# Patient Record
Sex: Female | Born: 1975
Health system: Southern US, Community
[De-identification: ages and names within clinical notes are randomized; demographics above are authoritative.]

## PROBLEM LIST (undated history)

## (undated) DIAGNOSIS — J45909 Unspecified asthma, uncomplicated: Secondary | ICD-10-CM

## (undated) DIAGNOSIS — E079 Disorder of thyroid, unspecified: Secondary | ICD-10-CM

## (undated) DIAGNOSIS — I10 Essential (primary) hypertension: Secondary | ICD-10-CM

## (undated) DIAGNOSIS — E119 Type 2 diabetes mellitus without complications: Secondary | ICD-10-CM

## (undated) DIAGNOSIS — D649 Anemia, unspecified: Secondary | ICD-10-CM

## (undated) DIAGNOSIS — E0500 Thyrotoxicosis with diffuse goiter without thyrotoxic crisis or storm: Secondary | ICD-10-CM

## (undated) HISTORY — PX: THYROIDECTOMY: SHX17

---

## 2019-01-30 ENCOUNTER — Emergency Department (HOSPITAL_COMMUNITY)
Admission: EM | Admit: 2019-01-30 | Discharge: 2019-01-30 | Disposition: A | Discharge status: Home / Self Care | Payer: BLUE CROSS/BLUE SHIELD | Attending: Emergency Medicine | Admitting: Emergency Medicine

## 2019-01-30 ENCOUNTER — Encounter (HOSPITAL_COMMUNITY): Payer: Self-pay | Admitting: Emergency Medicine

## 2019-01-30 ENCOUNTER — Other Ambulatory Visit: Payer: Self-pay

## 2019-01-30 DIAGNOSIS — B349 Viral infection, unspecified: Secondary | ICD-10-CM | POA: Diagnosis not present

## 2019-01-30 DIAGNOSIS — M7918 Myalgia, other site: Secondary | ICD-10-CM | POA: Diagnosis present

## 2019-01-30 DIAGNOSIS — J45909 Unspecified asthma, uncomplicated: Secondary | ICD-10-CM | POA: Insufficient documentation

## 2019-01-30 DIAGNOSIS — E119 Type 2 diabetes mellitus without complications: Secondary | ICD-10-CM | POA: Insufficient documentation

## 2019-01-30 DIAGNOSIS — I10 Essential (primary) hypertension: Secondary | ICD-10-CM | POA: Insufficient documentation

## 2019-01-30 DIAGNOSIS — R509 Fever, unspecified: Secondary | ICD-10-CM | POA: Insufficient documentation

## 2019-01-30 DIAGNOSIS — Z20822 Contact with and (suspected) exposure to covid-19: Secondary | ICD-10-CM | POA: Insufficient documentation

## 2019-01-30 HISTORY — DX: Anemia, unspecified: D64.9

## 2019-01-30 HISTORY — DX: Essential (primary) hypertension: I10

## 2019-01-30 HISTORY — DX: Thyrotoxicosis with diffuse goiter without thyrotoxic crisis or storm: E05.00

## 2019-01-30 HISTORY — DX: Type 2 diabetes mellitus without complications: E11.9

## 2019-01-30 HISTORY — DX: Unspecified asthma, uncomplicated: J45.909

## 2019-01-30 HISTORY — DX: Disorder of thyroid, unspecified: E07.9

## 2019-01-30 LAB — SARS CORONAVIRUS 2 (TAT 6-24 HRS): SARS Coronavirus 2: NEGATIVE

## 2019-01-30 NOTE — ED Triage Notes (Signed)
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 Had a covid test couple days ago and was negative. Had Motrin 400mg  4 hours ago. alos been alternating tylenol with the motrin.

## 2019-01-30 NOTE — Discharge Instructions (Signed)
Person Under Monitoring Name: Kristin Hurst  Location: [address] [place] [address]   Infection Prevention Recommendations for Individuals Confirmed to have, or Being Evaluated for, 2019 Novel Coronavirus (COVID-19) Infection Who Receive Care at Home  Individuals who are confirmed to have, or are being evaluated for, COVID-19 should follow the prevention steps below until a healthcare provider or local or state health department says they can return to normal activities.  Stay home except to get medical care You should restrict activities outside your home, except for getting medical care. Do not go to work, school, or public areas, and do not use public transportation or taxis.  Call ahead before visiting your doctor Before your medical appointment, call the healthcare provider and tell them that you have, or are being evaluated for, COVID-19 infection. This will help the healthcare provider's office take steps to keep other people from getting infected. Ask your healthcare provider to call the local or state health department.  Monitor your symptoms Seek prompt medical attention if your illness is worsening (e.g., difficulty breathing). Before going to your medical appointment, call the healthcare provider and tell them that you have, or are being evaluated for, COVID-19 infection. Ask your healthcare provider to call the local or state health department.  Wear a facemask You should wear a facemask that covers your nose and mouth when you are in the same room with other people and when you visit a healthcare provider. People who live with or visit you should also wear a facemask while they are in the same room with you.  Separate yourself from other people in your home As much as possible, you should stay in a different room from other people in your home. Also, you should use a separate bathroom, if available.  Avoid sharing household items You should  not share dishes, drinking glasses, cups, eating utensils, towels, bedding, or other items with other people in your home. After using these items, you should wash them thoroughly with soap and water.  Cover your coughs and sneezes Cover your mouth and nose with a tissue when you cough or sneeze, or you can cough or sneeze into your sleeve. Throw used tissues in a lined trash can, and immediately wash your hands with soap and water for at least 20 seconds or use an alcohol-based hand rub.  Wash your [employer] your hands often and thoroughly with soap and water for at least 20 seconds. You can use an alcohol-based hand sanitizer if soap and water are not available and if your hands are not visibly dirty. Avoid touching your eyes, nose, and mouth with unwashed hands.   Prevention Steps for Caregivers and Household Members of Individuals Confirmed to have, or Being Evaluated for, COVID-19 Infection Being Cared for in the Home  If you live with, or provide care at home for, a person confirmed to have, or being evaluated for, COVID-19 infection please follow these guidelines to prevent infection:  Follow healthcare provider's instructions Make sure that you understand and can help the patient follow any healthcare provider instructions for all care.  Provide for the patient's basic needs You should help the patient with basic needs in the home and provide support for getting groceries, prescriptions, and other personal needs.  Monitor the patient's symptoms If they are getting sicker, call his or her medical provider and tell them that the patient has, or is being evaluated for, COVID-19 infection. This will help the healthcare provider's office  take steps to keep other people from getting infected. Ask the healthcare provider to call the local or state health department.  Limit the number of people who have contact with the patient If possible, have only one caregiver for the  patient. Other household members should stay in another home or place of residence. If this is not possible, they should stay in another room, or be separated from the patient as much as possible. Use a separate bathroom, if available. Restrict visitors who do not have an essential need to be in the home.  Keep older adults, very young children, and other sick people away from the patient Keep older adults, very young children, and those who have compromised immune systems or chronic health conditions away from the patient. This includes people with chronic heart, lung, or kidney conditions, diabetes, and cancer.  Ensure good ventilation Make sure that shared spaces in the home have good air flow, such as from an air conditioner or an opened window, weather permitting.  Wash your hands often Wash your hands often and thoroughly with soap and water for at least 20 seconds. You can use an alcohol based hand sanitizer if soap and water are not available and if your hands are not visibly dirty. Avoid touching your eyes, nose, and mouth with unwashed hands. Use disposable paper towels to dry your hands. If not available, use dedicated cloth towels and replace them when they become wet.  Wear a facemask and gloves Wear a disposable facemask at all times in the room and gloves when you touch or have contact with the patient's blood, body fluids, and/or secretions or excretions, such as sweat, saliva, sputum, nasal mucus, vomit, urine, or feces.  Ensure the mask fits over your nose and mouth tightly, and do not touch it during use. Throw out disposable facemasks and gloves after using them. Do not reuse. Wash your hands immediately after removing your facemask and gloves. If your personal clothing becomes contaminated, carefully remove clothing and launder. Wash your hands after handling contaminated clothing. Place all used disposable facemasks, gloves, and other waste in a lined container before  disposing them with other household waste. Remove gloves and wash your hands immediately after handling these items.  Do not share dishes, glasses, or other household items with the patient Avoid sharing household items. You should not share dishes, drinking glasses, cups, eating utensils, towels, bedding, or other items with a patient who is confirmed to have, or being evaluated for, COVID-19 infection. After the person uses these items, you should wash them thoroughly with soap and water.  Wash laundry thoroughly Immediately remove and wash clothes or bedding that have blood, body fluids, and/or secretions or excretions, such as sweat, saliva, sputum, nasal mucus, vomit, urine, or feces, on them. Wear gloves when handling laundry from the patient. Read and follow directions on labels of laundry or clothing items and detergent. In general, wash and dry with the warmest temperatures recommended on the label.  Clean all areas the individual has used often Clean all touchable surfaces, such as counters, tabletops, doorknobs, bathroom fixtures, toilets, phones, keyboards, tablets, and bedside tables, every day. Also, clean any surfaces that may have blood, body fluids, and/or secretions or excretions on them. Wear gloves when cleaning surfaces the patient has come in contact with. Use a diluted bleach solution (e.g., dilute bleach with 1 part bleach and 10 parts water) or a household disinfectant with a label that says EPA-registered for coronaviruses. To make a bleach  solution at home, add 1 tablespoon of bleach to 1 quart (4 cups) of water. For a larger supply, add  cup of bleach to 1 gallon (16 cups) of water. Read labels of cleaning products and follow recommendations provided on product labels. Labels contain instructions for safe and effective use of the cleaning product including precautions you should take when applying the product, such as wearing gloves or eye protection and making sure you  have good ventilation during use of the product. Remove gloves and wash hands immediately after cleaning.  Monitor yourself for signs and symptoms of illness Caregivers and household members are considered close contacts, should monitor their health, and will be asked to limit movement outside of the home to the extent possible. Follow the monitoring steps for close contacts listed on the symptom monitoring form.   ? If you have additional questions, contact your local health department or call the epidemiologist on call at [phone] (available 24/7). ? This guidance is subject to change. For the most up-to-date guidance from [facility], please refer to their website: [email or web address]

## 2019-01-30 NOTE — ED Provider Notes (Signed)
[facility]-EMERGENCY DEPT Provider Note   CSN: [id number] Arrival date & time: 01/30/19  8676     History Chief Complaint  Patient presents with  . Generalized Body Aches  . Fever  . Chills  . Cough    Kristin Hurst is a 44 y.o. female.  44 year old female with history of diabetes presents with 3 days of URI symptoms.  She has had cough and congestion.  No vomiting or diarrhea.  Has had fever which has been treated with Motrin as well as Tylenol.  Denies any dyspnea on exertion.  Some scratchy throat but no trouble swallowing.  Had a rapid Covid test done several days ago which was negative.  Does work in a nursing facility.        Past Medical History:  Diagnosis Date  . Anemia   . Asthma   . Diabetes mellitus without complication (HCC)   . Graves disease   . Hypertension   . Thyroid disease     There are no problems to display for this patient.   Past Surgical History:  Procedure Laterality Date  . THYROIDECTOMY       OB History   No obstetric history on file.     No family history on file.  Social History   Tobacco Use  . Smoking status: Not on file  Substance Use Topics  . Alcohol use: Not on file  . Drug use: Not on file    Home Medications Prior to Admission medications   Not on File    Allergies    Patient has no allergy information on record.  Review of Systems   Review of Systems  All other systems reviewed and are negative.   Physical Exam Updated Vital Signs BP 131/87 (BP Location: Right Arm)   Pulse 88   Temp 98.3 F (36.8 C) (Oral)   Resp 20   SpO2 100%   Physical Exam Vitals and nursing note reviewed.  Constitutional:      General: She is not in acute distress.    Appearance: Normal appearance. She is well-developed. She is not toxic-appearing.  HENT:     Head: Normocephalic and atraumatic.  Eyes:     General: Lids are normal.     Conjunctiva/sclera: Conjunctivae normal.     Pupils: Pupils  are equal, round, and reactive to light.  Neck:     Thyroid: No thyroid mass.     Trachea: No tracheal deviation.  Cardiovascular:     Rate and Rhythm: Normal rate and regular rhythm.     Heart sounds: Normal heart sounds. No murmur. No gallop.   Pulmonary:     Effort: Pulmonary effort is normal. No respiratory distress.     Breath sounds: Normal breath sounds. No stridor. No decreased breath sounds, wheezing, rhonchi or rales.  Abdominal:     General: Bowel sounds are normal. There is no distension.     Palpations: Abdomen is soft.     Tenderness: There is no abdominal tenderness. There is no rebound.  Musculoskeletal:        General: No tenderness. Normal range of motion.     Cervical back: Normal range of motion and neck supple.  Skin:    General: Skin is warm and dry.     Findings: No abrasion or rash.  Neurological:     Mental Status: She is alert and oriented to person, place, and time.     GCS: GCS eye subscore is 4. GCS verbal  subscore is 5. GCS motor subscore is 6.     Cranial Nerves: No cranial nerve deficit.     Sensory: No sensory deficit.  Psychiatric:        Speech: Speech normal.        Behavior: Behavior normal.     ED Results / Procedures / Treatments   Labs (all labs ordered are listed, but only abnormal results are displayed) Labs Reviewed  SARS CORONAVIRUS 2 (TAT 6-24 HRS)    EKG None  Radiology No results found.  Procedures Procedures (including critical care time)  Medications Ordered in ED Medications - No data to display  ED Course  I have reviewed the triage vital signs and the nursing notes.  Pertinent labs & imaging results that were available during my care of the patient were reviewed by me and considered in my medical decision making (see chart for details).    MDM Rules/Calculators/A&P                      Patient's vital signs are stable here.  Patient will have PCR Covid test sent.  Has been instructed to quarantine. Final  Clinical Impression(s) / ED Diagnoses Final diagnoses:  None    Rx / DC Orders ED Discharge Orders    None       Lacretia Leigh, MD 01/30/19 [phone]

## 2019-02-10 ENCOUNTER — Emergency Department (HOSPITAL_COMMUNITY): Payer: BLUE CROSS/BLUE SHIELD

## 2019-02-10 ENCOUNTER — Other Ambulatory Visit: Payer: Self-pay

## 2019-02-10 ENCOUNTER — Encounter (HOSPITAL_COMMUNITY): Payer: Self-pay | Admitting: Emergency Medicine

## 2019-02-10 ENCOUNTER — Emergency Department (HOSPITAL_COMMUNITY)
Admission: EM | Admit: 2019-02-10 | Discharge: 2019-02-10 | Disposition: A | Discharge status: Home / Self Care | Payer: BLUE CROSS/BLUE SHIELD | Attending: Emergency Medicine | Admitting: Emergency Medicine

## 2019-02-10 DIAGNOSIS — E119 Type 2 diabetes mellitus without complications: Secondary | ICD-10-CM | POA: Diagnosis not present

## 2019-02-10 DIAGNOSIS — Z20822 Contact with and (suspected) exposure to covid-19: Secondary | ICD-10-CM | POA: Diagnosis not present

## 2019-02-10 DIAGNOSIS — Z79899 Other long term (current) drug therapy: Secondary | ICD-10-CM | POA: Diagnosis not present

## 2019-02-10 DIAGNOSIS — R05 Cough: Secondary | ICD-10-CM | POA: Diagnosis present

## 2019-02-10 DIAGNOSIS — I10 Essential (primary) hypertension: Secondary | ICD-10-CM | POA: Diagnosis not present

## 2019-02-10 DIAGNOSIS — B349 Viral infection, unspecified: Secondary | ICD-10-CM | POA: Insufficient documentation

## 2019-02-10 DIAGNOSIS — E876 Hypokalemia: Secondary | ICD-10-CM | POA: Insufficient documentation

## 2019-02-10 DIAGNOSIS — J45909 Unspecified asthma, uncomplicated: Secondary | ICD-10-CM | POA: Insufficient documentation

## 2019-02-10 LAB — CBC WITH DIFFERENTIAL/PLATELET
Abs Immature Granulocytes: 0.04 10*3/uL (ref 0.00–0.07)
Basophils Absolute: 0.1 10*3/uL (ref 0.0–0.1)
Basophils Relative: 0 %
Eosinophils Absolute: 0.1 10*3/uL (ref 0.0–0.5)
Eosinophils Relative: 1 %
HCT: 36.8 % (ref 36.0–46.0)
Hemoglobin: 10.1 g/dL — ABNORMAL LOW (ref 12.0–15.0)
Immature Granulocytes: 0 %
Lymphocytes Relative: 28 %
Lymphs Abs: 3.5 10*3/uL (ref 0.7–4.0)
MCH: 18.8 pg — ABNORMAL LOW (ref 26.0–34.0)
MCHC: 27.4 g/dL — ABNORMAL LOW (ref 30.0–36.0)
MCV: 68.7 fL — ABNORMAL LOW (ref 80.0–100.0)
Monocytes Absolute: 0.6 10*3/uL (ref 0.1–1.0)
Monocytes Relative: 5 %
Neutro Abs: 8.1 10*3/uL — ABNORMAL HIGH (ref 1.7–7.7)
Neutrophils Relative %: 66 %
Platelets: 297 10*3/uL (ref 150–400)
RBC: 5.36 MIL/uL — ABNORMAL HIGH (ref 3.87–5.11)
RDW: 22.1 % — ABNORMAL HIGH (ref 11.5–15.5)
WBC: 12.3 10*3/uL — ABNORMAL HIGH (ref 4.0–10.5)
nRBC: 0 % (ref 0.0–0.2)

## 2019-02-10 LAB — BASIC METABOLIC PANEL
Anion gap: 13 (ref 5–15)
BUN: 9 mg/dL (ref 6–20)
CO2: 26 mmol/L (ref 22–32)
Calcium: 8.9 mg/dL (ref 8.9–10.3)
Chloride: 100 mmol/L (ref 98–111)
Creatinine, Ser: 0.65 mg/dL (ref 0.44–1.00)
GFR calc Af Amer: >60 mL/min (ref >60)
GFR calc non Af Amer: >60 mL/min (ref >60)
Glucose, Bld: 125 mg/dL — ABNORMAL HIGH (ref 70–99)
Potassium: 2.6 mmol/L — CL (ref 3.5–5.1)
Sodium: 139 mmol/L (ref 135–145)

## 2019-02-10 LAB — D-DIMER, QUANTITATIVE: D-Dimer, Quant: 1.63 ug/mL-FEU — ABNORMAL HIGH (ref 0.00–0.50)

## 2019-02-10 LAB — MAGNESIUM: Magnesium: 1.7 mg/dL (ref 1.7–2.4)

## 2019-02-10 MED ORDER — SODIUM CHLORIDE 0.9 % IV BOLUS
1000.0000 mL | Freq: Once | INTRAVENOUS | Status: AC
  Administered 2019-02-10: 1000 mL via INTRAVENOUS

## 2019-02-10 MED ORDER — OXYCODONE-ACETAMINOPHEN 5-325 MG PO TABS
1.0000 | ORAL_TABLET | Freq: Once | ORAL | Status: AC
  Administered 2019-02-10: 1 via ORAL
  Filled 2019-02-10: qty 1

## 2019-02-10 MED ORDER — POTASSIUM CHLORIDE CRYS ER 20 MEQ PO TBCR
40.0000 meq | EXTENDED_RELEASE_TABLET | Freq: Once | ORAL | Status: AC
  Administered 2019-02-10: 40 meq via ORAL
  Filled 2019-02-10: qty 2

## 2019-02-10 MED ORDER — POTASSIUM CHLORIDE 10 MEQ/100ML IV SOLN
10.0000 meq | INTRAVENOUS | Status: DC
  Administered 2019-02-10 (×2): 10 meq via INTRAVENOUS
  Filled 2019-02-10 (×2): qty 100

## 2019-02-10 MED ORDER — POTASSIUM CHLORIDE CRYS ER 20 MEQ PO TBCR: 20.0000 meq | EXTENDED_RELEASE_TABLET | Freq: Two times a day (BID) | ORAL | 0 refills | Status: DC

## 2019-02-10 NOTE — ED Triage Notes (Signed)
Pt reports that she was seen here 2 weeks ago and was Covid negative, told has the flu. Reports still having bad dry cough, body aches, and chills.

## 2019-02-10 NOTE — ED Provider Notes (Signed)
[facility] DEPT Provider Note   CSN: [id number] Arrival date & time: 02/10/19  1710     History Chief Complaint  Patient presents with   Cough   Generalized Body Aches   Chills    Kristin Hurst is a 44 y.o. female.  HPI  Patient is 44 year old female with a history of Graves' disease status post thyroidectomy now on levothyroxine history of GERD, hypertension on medication for this.  Patient presents today for now over 2 weeks of body aches, cough, congestion, hoarse voice. Patient was seen 2 weeks ago and diagnosed with URI at this emergency department after negative PCR Covid test.  She states that her symptoms have persisted and states that she is having some mild shortness of breath, fatigue, body aches intermittent swelling and tenderness and occasional cramping in both her legs that is currently not present.  She also states she has had a hoarse voice.  Does endorse diarrhea for the past week as well.   Patient denies any weight changes, hyper or hypothermia.  Heat or cold intolerance.  Denies sweatiness or dry skin.  Denies any abdominal pain.       Past Medical History:  Diagnosis Date   Anemia    Asthma    Diabetes mellitus without complication ([place])    Graves disease    Hypertension    Thyroid disease     There are no problems to display for this patient.   Past Surgical History:  Procedure Laterality Date   THYROIDECTOMY       OB History   No obstetric history on file.     No family history on file.  Social History   Tobacco Use   Smoking status: Not on file  Substance Use Topics   Alcohol use: Not on file   Drug use: Not on file    Home Medications Prior to Admission medications   Medication Sig Start Date End Date Taking? Authorizing Provider  albuterol (VENTOLIN HFA) 108 (90 Base) MCG/ACT inhaler Inhale 1-2 puffs into the lungs every 6 (six) hours as needed for wheezing or shortness of breath.    Yes [provider]  amLODipine (NORVASC) 10 MG tablet Take 10 mg by mouth daily.   Yes [provider]  azelastine (OPTIVAR) 0.05 % ophthalmic solution Place 1 drop into both eyes daily.   Yes [provider]  calcium carbonate (OS-CAL - DOSED IN MG OF ELEMENTAL CALCIUM) 1250 (500 Ca) MG tablet Take 1 tablet by mouth daily.   Yes [provider]  Dulaglutide (TRULICITY) 3.32 RJ/1.8AC SOPN Inject 0.75 mg into the skin once a week.   Yes [provider]  ferrous sulfate 325 (65 FE) MG tablet Take 325 mg by mouth daily with breakfast.   Yes [provider]  levothyroxine (SYNTHROID) 200 MCG tablet Take 200 mcg by mouth daily before breakfast.   Yes [provider]  omeprazole (PRILOSEC) 20 MG capsule Take 20 mg by mouth daily.   Yes [provider]  Potassium 99 MG TABS Take 1 tablet by mouth daily.   Yes [provider]  ramipril (ALTACE) 10 MG capsule Take 10 mg by mouth daily.   Yes [provider]  potassium chloride SA (KLOR-CON) 20 MEQ tablet Take 1 tablet (20 mEq total) by mouth 2 (two) times daily for 7 days. 02/10/19 02/17/19  Maudie Flakes, MD    Allergies    Shellfish allergy  Review of Systems   Review  of Systems  Constitutional: Positive for chills and fatigue. Negative for fever.  HENT: Positive for congestion.        Hoarse voice  Eyes: Negative for pain.  Respiratory: Positive for chest tightness and shortness of breath. Negative for cough.   Cardiovascular: Positive for leg swelling (intermittent). Negative for chest pain and palpitations.  Gastrointestinal: Negative for abdominal pain and vomiting.  Genitourinary: Negative for dysuria.  Musculoskeletal: Negative for myalgias.       BL leg cramps  Skin: Negative for rash.  Neurological: Negative for dizziness and headaches.    Physical Exam Updated Vital Signs BP 138/85 (BP Location: Right Arm)    Pulse 88    Temp 98.9 F (37.2  C) (Oral)    Resp 16    LMP 01/23/2019    SpO2 100%   Physical Exam Vitals and nursing note reviewed.  Constitutional:      General: She is not in acute distress.    Comments: Patient is pleasant 44 year old female appears stated age no acute distress  HENT:     Head: Normocephalic and atraumatic.     Nose: Nose normal.  Eyes:     General: No scleral icterus. Cardiovascular:     Rate and Rhythm: Regular rhythm. Tachycardia present.     Pulses: Normal pulses.     Heart sounds: Normal heart sounds.     Comments: HR 110 Pulmonary:     Effort: Pulmonary effort is normal. No respiratory distress.     Breath sounds: No wheezing.  Abdominal:     Palpations: Abdomen is soft.     Tenderness: There is no abdominal tenderness.  Musculoskeletal:     Cervical back: Normal range of motion.     Right lower leg: No edema.     Left lower leg: No edema.     Comments: No lower extremity edema, no leg swelling, negative Homans' sign  Skin:    General: Skin is warm and dry.     Capillary Refill: Capillary refill takes less than 2 seconds.  Neurological:     Mental Status: She is alert. Mental status is at baseline.     Comments: Patient moves all 4 extremities, normal gait, no sensation loss. CN intact.   Psychiatric:        Mood and Affect: Mood normal.        Behavior: Behavior normal.     ED Results / Procedures / Treatments   Labs (all labs ordered are listed, but only abnormal results are displayed) Labs Reviewed  BASIC METABOLIC PANEL - Abnormal; Notable for the following components:      Result Value   Potassium 2.6 (*)    Glucose, Bld 125 (*)    All other components within normal limits  CBC WITH DIFFERENTIAL/PLATELET - Abnormal; Notable for the following components:   WBC 12.3 (*)    RBC 5.36 (*)    Hemoglobin 10.1 (*)    MCV 68.7 (*)    MCH 18.8 (*)    MCHC 27.4 (*)    RDW 22.1 (*)    Neutro Abs 8.1 (*)    All other components within normal limits  D-DIMER,  QUANTITATIVE (NOT AT [facility]) - Abnormal; Notable for the following components:   D-Dimer, Quant 1.63 (*)    All other components within normal limits  SARS CORONAVIRUS 2 (TAT 6-24 HRS)  MAGNESIUM    EKG EKG Interpretation  Date/Time:  Wednesday February 10 2019 20:26:43 EST Ventricular Rate:  91 PR  Interval:    QRS Duration: 82 QT Interval:  391 QTC Calculation: 482 R Axis:   32 Text Interpretation: Sinus rhythm Low voltage, precordial leads Abnormal R-wave progression, early transition Confirmed by Kennis Carina [id number]) on 02/10/2019 8:32:47 PM   Radiology DG Chest Portable 1 View  Result Date: 02/10/2019 CLINICAL DATA:  Shortness of breath EXAM: PORTABLE CHEST 1 VIEW COMPARISON:  None. FINDINGS: The heart size and mediastinal contours are within normal limits. Both lungs are clear. The visualized skeletal structures are unremarkable. Postsurgical changes at the thoracic inlet. IMPRESSION: No active disease. Electronically Signed   By: Jasmine Pang M.D.   On: 02/10/2019 18:57    Procedures Procedures (including critical care time)  Medications Ordered in ED Medications  potassium chloride 10 mEq in 100 mL IVPB (0 mEq Intravenous Stopped 02/10/19 2220)  oxyCODONE-acetaminophen (PERCOCET/ROXICET) 5-325 MG per tablet 1 tablet (1 tablet Oral Given 02/10/19 1819)  sodium chloride 0.9 % bolus 1,000 mL (0 mLs Intravenous Stopped 02/10/19 1953)  potassium chloride SA (KLOR-CON) CR tablet 40 mEq (40 mEq Oral Given 02/10/19 2018)    ED Course  I have reviewed the triage vital signs and the nursing notes.  Pertinent labs & imaging results that were available during my care of the patient were reviewed by me and considered in my medical decision making (see chart for details).  Patient presents today for 2 weeks of cough, congestion, fatigue, body aches, leg cramps, hoarse voice.  She had negative Covid test beginning of the symptoms when seen in ED.  Her symptoms did not change she has no  new known exposures no new symptoms to indicate that she has Covid today.  Clinical Course as of Feb 11 24  Wed Feb 10, 2019  6256 Chest x-ray independently reviewed by myself.  No infiltrate, pulmonary edema or pleural effusion.  No broken bones.  DG Chest Portable 1 View [WF]  1924 Dimer elevated at 1.63 Dimer is elevated however patient is now not tachycardic after fluid administration.  She is found to be hypokalemic at 2.6.  Suspect this is the cause of her lower extremity pain.  Doubt DVT or PE.  Discussed case with Dr. Pilar Plate who discontinued CTPA study.   [WF]  2034 EKG is nonischemic.  Tachycardia has resolved with fluids.  No U waves present.  EKG 12-Lead [WF]  2134 Vitals WNLs with 100% sp02 no longer tachycardic. Afebrile and normal BP that normalized without intervention.     [WF]    Clinical Course User Index [WF] Gailen Shelter, PA   BMP notable for severe hypokalemia 2.6.  Will replete with p.o. 40 mEq and 3 IV doses of 10 mEq.  Magnesium is within normal limits.  Patient has mildly elevated WBC no baseline to compare to mild anemia 10.1.  Will discuss with patient there is no reference baseline labs to compare to.  Will see if patient can obtain reference lab work. Patient has baseline anemia of approximately 10. This is unchanged for her today.   Discussed results with patient she states she has had hypokalemia in the past but does not prescribed potassium supplementation she has been taking over-the-counter potassium occasionally is uncertain of the dose and states she takes it irregularly.  Does not take magnesium supplementation.  We will discharge patient with 40 equivalents potassium daily for the next 1 week as she will follow up with PCP within 1 month.  She states that she has insurance starting February 1.  On reassessment  of patient she is well appearing. Denies any SOB, CP and her SpO2 is 100% on room air with no tachycardia. Given she is PERC negative after K+  repletion and fluids I suspect her symptoms are due more to her hypokalemia than any VTE. Doubt PE or DVT. Discussed my reasoning with the patient who is understanding of plan and agreeable to treatment plan. She is given return precautions and will return to ED if she experieences any new or concerning symptoms. She was thoroughly educated on symptoms of PE/DVT.   Kristin Hurst was evaluated in Emergency Department on 02/11/2019 for the symptoms described in the history of present illness. She was evaluated in the context of the global COVID-19 pandemic, which necessitated consideration that the patient might be at risk for infection with the SARS-CoV-2 virus that causes COVID-19. Institutional protocols and algorithms that pertain to the evaluation of patients at risk for COVID-19 are in a state of rapid change based on information released by regulatory bodies including the CDC and federal and state organizations. These policies and algorithms were followed during the patient's care in the ED. I discussed this case with my attending physician who cosigned this note including patient's presenting symptoms, physical exam, and planned diagnostics and interventions. Attending physician stated agreement with plan or made changes to plan which were implemented.  Attending physician assessed patient at bedside.  MDM Rules/Calculators/A&P                       Final Clinical Impression(s) / ED Diagnoses Final diagnoses:  Hypokalemia  Viral illness    Rx / DC Orders ED Discharge Orders         Ordered    potassium chloride SA (KLOR-CON) 20 MEQ tablet  2 times daily     02/10/19 2138           Solon Augusta Arnett, [place] 02/11/19 0026    Sabas Sous, MD 02/12/19 2308

## 2019-02-10 NOTE — Discharge Instructions (Signed)
Please use Tylenol or ibuprofen for pain.  You may use 600 mg ibuprofen every 6 hours or 1000 mg of Tylenol every 6 hours.  You may choose to alternate between the 2.  This would be most effective.  Not to exceed 4 g of Tylenol within 24 hours.  Not to exceed 3200 mg ibuprofen 24 hours.  Take K+ as prescribed.   Eat K+ rich foods

## 2019-02-10 NOTE — ED Provider Notes (Signed)
  Provider Note MRN:  [id number]  Arrival date & time: 02/10/19    ED Course and Medical Decision Making  Assumed care from [facility] at shift change.  Patient has completed her runs of potassium and she is feeling better and requesting discharge.  We will continue repletion of potassium via diet and supplements for the next week and will follow up with PCP.  Strict return precautions.  .Critical Care Performed by: Sabas Sous, MD Authorized by: Sabas Sous, MD   Critical care provider statement:    Critical care time (minutes):  31   Critical care was necessary to treat or prevent imminent or life-threatening deterioration of the following conditions:  Metabolic crisis (Hypokalemia)   Critical care was time spent personally by me on the following activities:  Discussions with consultants, evaluation of patient's response to treatment, examination of patient, ordering and performing treatments and interventions, ordering and review of laboratory studies, ordering and review of radiographic studies, pulse oximetry, re-evaluation of patient's condition, obtaining history from patient or surrogate and review of old charts    Final Clinical Impressions(s) / ED Diagnoses     ICD-10-CM   1. Hypokalemia  E87.6   2. Viral illness  B34.9     ED Discharge Orders         Ordered    potassium chloride SA (KLOR-CON) 20 MEQ tablet  2 times daily     02/10/19 2138            Discharge Instructions     Please use Tylenol or ibuprofen for pain.  You may use 600 mg ibuprofen every 6 hours or 1000 mg of Tylenol every 6 hours.  You may choose to alternate between the 2.  This would be most effective.  Not to exceed 4 g of Tylenol within 24 hours.  Not to exceed 3200 mg ibuprofen 24 hours.  Take K+ as prescribed.   Eat K+ rich foods    Elmer Sow. Pilar Plate, MD [facility] [email or web address]    Sabas Sous, MD 02/10/19 2221

## 2019-02-11 LAB — SARS CORONAVIRUS 2 (TAT 6-24 HRS): SARS Coronavirus 2: NEGATIVE

## 2019-02-17 ENCOUNTER — Emergency Department (HOSPITAL_COMMUNITY): Payer: BLUE CROSS/BLUE SHIELD

## 2019-02-17 ENCOUNTER — Encounter (HOSPITAL_COMMUNITY): Payer: Self-pay

## 2019-02-17 ENCOUNTER — Other Ambulatory Visit: Payer: Self-pay

## 2019-02-17 ENCOUNTER — Emergency Department (HOSPITAL_COMMUNITY)
Admission: EM | Admit: 2019-02-17 | Discharge: 2019-02-17 | Disposition: A | Discharge status: Home / Self Care | Payer: BLUE CROSS/BLUE SHIELD | Attending: Emergency Medicine | Admitting: Emergency Medicine

## 2019-02-17 DIAGNOSIS — M79605 Pain in left leg: Secondary | ICD-10-CM | POA: Insufficient documentation

## 2019-02-17 DIAGNOSIS — R42 Dizziness and giddiness: Secondary | ICD-10-CM | POA: Insufficient documentation

## 2019-02-17 DIAGNOSIS — J45909 Unspecified asthma, uncomplicated: Secondary | ICD-10-CM | POA: Insufficient documentation

## 2019-02-17 DIAGNOSIS — Z79899 Other long term (current) drug therapy: Secondary | ICD-10-CM | POA: Diagnosis not present

## 2019-02-17 DIAGNOSIS — F17210 Nicotine dependence, cigarettes, uncomplicated: Secondary | ICD-10-CM | POA: Diagnosis not present

## 2019-02-17 DIAGNOSIS — M79604 Pain in right leg: Secondary | ICD-10-CM | POA: Diagnosis present

## 2019-02-17 DIAGNOSIS — E119 Type 2 diabetes mellitus without complications: Secondary | ICD-10-CM | POA: Diagnosis not present

## 2019-02-17 DIAGNOSIS — I10 Essential (primary) hypertension: Secondary | ICD-10-CM | POA: Insufficient documentation

## 2019-02-17 LAB — COMPREHENSIVE METABOLIC PANEL
ALT: 13 U/L (ref 0–44)
AST: 16 U/L (ref 15–41)
Albumin: 3.5 g/dL (ref 3.5–5.0)
Alkaline Phosphatase: 80 U/L (ref 38–126)
Anion gap: 12 (ref 5–15)
BUN: 10 mg/dL (ref 6–20)
CO2: 24 mmol/L (ref 22–32)
Calcium: 8.8 mg/dL — ABNORMAL LOW (ref 8.9–10.3)
Chloride: 104 mmol/L (ref 98–111)
Creatinine, Ser: 0.62 mg/dL (ref 0.44–1.00)
GFR calc Af Amer: >60 mL/min (ref >60)
GFR calc non Af Amer: >60 mL/min (ref >60)
Glucose, Bld: 162 mg/dL — ABNORMAL HIGH (ref 70–99)
Potassium: 3 mmol/L — ABNORMAL LOW (ref 3.5–5.1)
Sodium: 140 mmol/L (ref 135–145)
Total Bilirubin: 0.4 mg/dL (ref 0.3–1.2)
Total Protein: 7.2 g/dL (ref 6.5–8.1)

## 2019-02-17 LAB — URINALYSIS, ROUTINE W REFLEX MICROSCOPIC
Bacteria, UA: NONE SEEN
Bilirubin Urine: NEGATIVE
Glucose, UA: NEGATIVE mg/dL
Ketones, ur: NEGATIVE mg/dL
Leukocytes,Ua: NEGATIVE
Nitrite: NEGATIVE
Protein, ur: 30 mg/dL — AB
Specific Gravity, Urine: 1.024 (ref 1.005–1.030)
pH: 6 (ref 5.0–8.0)

## 2019-02-17 LAB — CBC WITH DIFFERENTIAL/PLATELET
Abs Immature Granulocytes: 0.05 10*3/uL (ref 0.00–0.07)
Basophils Absolute: 0 10*3/uL (ref 0.0–0.1)
Basophils Relative: 0 %
Eosinophils Absolute: 0.2 10*3/uL (ref 0.0–0.5)
Eosinophils Relative: 2 %
HCT: 34.5 % — ABNORMAL LOW (ref 36.0–46.0)
Hemoglobin: 9.6 g/dL — ABNORMAL LOW (ref 12.0–15.0)
Immature Granulocytes: 1 %
Lymphocytes Relative: 29 %
Lymphs Abs: 3.2 10*3/uL (ref 0.7–4.0)
MCH: 19.3 pg — ABNORMAL LOW (ref 26.0–34.0)
MCHC: 27.8 g/dL — ABNORMAL LOW (ref 30.0–36.0)
MCV: 69.3 fL — ABNORMAL LOW (ref 80.0–100.0)
Monocytes Absolute: 0.5 10*3/uL (ref 0.1–1.0)
Monocytes Relative: 4 %
Neutro Abs: 7.1 10*3/uL (ref 1.7–7.7)
Neutrophils Relative %: 64 %
Platelets: 308 10*3/uL (ref 150–400)
RBC: 4.98 MIL/uL (ref 3.87–5.11)
RDW: 22.7 % — ABNORMAL HIGH (ref 11.5–15.5)
WBC: 11.1 10*3/uL — ABNORMAL HIGH (ref 4.0–10.5)
nRBC: 0 % (ref 0.0–0.2)

## 2019-02-17 LAB — I-STAT BETA HCG BLOOD, ED (MC, WL, AP ONLY): I-stat hCG, quantitative: <5 m[IU]/mL (ref <5)

## 2019-02-17 LAB — TSH: TSH: 0.381 u[IU]/mL (ref 0.350–4.500)

## 2019-02-17 LAB — MAGNESIUM: Magnesium: 1.7 mg/dL (ref 1.7–2.4)

## 2019-02-17 MED ORDER — POTASSIUM CHLORIDE CRYS ER 20 MEQ PO TBCR
40.0000 meq | EXTENDED_RELEASE_TABLET | Freq: Once | ORAL | Status: AC
  Administered 2019-02-17: 40 meq via ORAL
  Filled 2019-02-17: qty 2

## 2019-02-17 MED ORDER — SODIUM CHLORIDE 0.9 % IV BOLUS
1000.0000 mL | Freq: Once | INTRAVENOUS | Status: AC
  Administered 2019-02-17: 1000 mL via INTRAVENOUS

## 2019-02-17 MED ORDER — NAPROXEN 500 MG PO TABS: 500.0000 mg | ORAL_TABLET | Freq: Two times a day (BID) | ORAL | 0 refills | Status: DC

## 2019-02-17 MED ORDER — OXYCODONE-ACETAMINOPHEN 5-325 MG PO TABS
2.0000 | ORAL_TABLET | Freq: Once | ORAL | Status: AC
  Administered 2019-02-17: 2 via ORAL
  Filled 2019-02-17: qty 2

## 2019-02-17 MED ORDER — METHOCARBAMOL 500 MG PO TABS
500.0000 mg | ORAL_TABLET | Freq: Once | ORAL | Status: AC
  Administered 2019-02-17: 500 mg via ORAL
  Filled 2019-02-17: qty 1

## 2019-02-17 MED ORDER — POTASSIUM CHLORIDE CRYS ER 20 MEQ PO TBCR: 20.0000 meq | EXTENDED_RELEASE_TABLET | Freq: Every day | ORAL | 0 refills | Status: DC

## 2019-02-17 MED ORDER — KETOROLAC TROMETHAMINE 15 MG/ML IJ SOLN
15.0000 mg | Freq: Once | INTRAMUSCULAR | Status: AC
  Administered 2019-02-17: 15 mg via INTRAVENOUS
  Filled 2019-02-17: qty 1

## 2019-02-17 MED ORDER — METHOCARBAMOL 500 MG PO TABS: 500.0000 mg | ORAL_TABLET | Freq: Three times a day (TID) | ORAL | 0 refills | Status: DC | PRN

## 2019-02-17 NOTE — Discharge Instructions (Addendum)
You were seen in the emergency department today for lightheadedness and leg pain.  Your work-up was overall reassuring.  Your labs look similar to prior labs, your potassium was somewhat low, we are sending home with a supplement and providing diet information.  Your calcium was also a bit low please see attached diet guidelines.  Your hemoglobin was mildly low with fairly similar to previous consistent with your anemia.  Your chest x-ray was normal.  We are sending you home with the following medicines:  - Naproxen is a nonsteroidal anti-inflammatory medication that will help with pain and swelling. Be sure to take this medication as prescribed with food, 1 pill every 12 hours,  It should be taken with food, as it can cause stomach upset, and more seriously, stomach bleeding. Do not take other nonsteroidal anti-inflammatory medications with this such as Advil, Motrin, Aleve, Mobic, Goodie Powder, or Motrin.    - Robaxin is the muscle relaxer I have prescribed, this is meant to help with muscle tightness. Be aware that this medication may make you drowsy therefore the first time you take this it should be at a time you are in an environment where you can rest. Do not drive or operate heavy machinery when taking this medication. Do not drink alcohol or take other sedating medications with this medicine such as narcotics or benzodiazepines.   You make take Tylenol per over the counter dosing with these medications.   We have prescribed you new medication(s) today. Discuss the medications prescribed today with your pharmacist as they can have adverse effects and interactions with your other medicines including over the counter and prescribed medications. Seek medical evaluation if you start to experience new or abnormal symptoms after taking one of these medicines, seek care immediately if you start to experience difficulty breathing, feeling of your throat closing, facial swelling, or rash as these could be  indications of a more serious allergic reaction   Please also take the potassium tablet.  Please follow-up with other pain management, primary care, rheumatology locally within the next 48 hours if possible.  Return to the emergency department for new or worsening symptoms including but not limited to increased pain, passing out, chest pain, or any other concerns.

## 2019-02-17 NOTE — ED Triage Notes (Signed)
Patient states she was seen 3 weeks ago and was told she had the flu. Patient was Covid negative at that time. Patient c/o extremity pain, cough, and SOB.

## 2019-02-17 NOTE — ED Notes (Signed)
Pt requesting additional pain medication. PA aware.  

## 2019-02-17 NOTE — ED Notes (Signed)
Pt transported to Xray at this time.

## 2019-02-17 NOTE — ED Provider Notes (Signed)
[facility] DEPT Provider Note   CSN: [id number] Arrival date & time: 02/17/19  1530     History Chief Complaint  Patient presents with  . Dizziness  . Extremity Pain    Kristin Hurst is a 44 y.o. female with a history of anemia, asthma, diabetes mellitus, hypertension, and Graves' disease status post thyroidectomy on Synthroid who presents to the emergency department with complaints of intermittent lightheadedness today as well as acute on chronic lower extremity pain.  Patient states that when she had transition from sitting to standing she has felt lightheaded if she might pass out 2 separate times today.  This lightheadedness improved when returning to the seated position.  No other alleviating or aggravating factors.  No true syncope occurred.  She also notes that she is having pain to her bilateral lower extremities which is severe, constant, worse with movement, no alleviating factors.  She states this pain is consistent with her chronic pain related to her rheumatoid arthritis in terms of quality but it is worse in severity.  She states that she takes 10 mg of immediate release oxycodone 3 times daily as well as 10 mg of extended release oxycodone twice daily.  She ran out of her pain medication about a week ago.  Her pain management doctor is in [place].  She does not get new medicines until the beginning of February.  She is new to the area and has not established care with a pain management doctor here yet.  She also mentions that she feels a bit nauseated, has had nasal congestion, and feels short of breath at times but states she is unsure if the shortness of breath is related to her smoking.  Denies fever, chills, emesis, chest pain, syncope, abdominal pain, melena, hematochezia, leg swelling, hemoptysis, recent surgery/trauma, recent long travel, hormone use, personal hx of cancer, or hx of DVT/PE.   HPI     Past Medical History:  Diagnosis Date    . Anemia   . Asthma   . Diabetes mellitus without complication ([place])   . Graves disease   . Hypertension   . Thyroid disease     There are no problems to display for this patient.   Past Surgical History:  Procedure Laterality Date  . THYROIDECTOMY       OB History   No obstetric history on file.     Family History  Problem Relation Age of Onset  . Heart failure Mother   . Diabetes Mother     Social History   Tobacco Use  . Smoking status: Current Every Day Smoker    Packs/day: 0.50    Types: Cigarettes  . Smokeless tobacco: Never Used  Substance Use Topics  . Alcohol use: Yes  . Drug use: Never    Home Medications Prior to Admission medications   Medication Sig Start Date End Date Taking? Authorizing Provider  albuterol (VENTOLIN HFA) 108 (90 Base) MCG/ACT inhaler Inhale 1-2 puffs into the lungs every 6 (six) hours as needed for wheezing or shortness of breath.    [provider]  amLODipine (NORVASC) 10 MG tablet Take 10 mg by mouth daily.    [provider]  azelastine (OPTIVAR) 0.05 % ophthalmic solution Place 1 drop into both eyes daily.    [provider]  calcium carbonate (OS-CAL - DOSED IN MG OF ELEMENTAL CALCIUM) 1250 (500 Ca) MG tablet Take 1 tablet by mouth daily.    [provider]  Dulaglutide (  TRULICITY) 0.75 MG/0.5ML SOPN Inject 0.75 mg into the skin once a week.    [provider]  ferrous sulfate 325 (65 FE) MG tablet Take 325 mg by mouth daily with breakfast.    [provider]  levothyroxine (SYNTHROID) 200 MCG tablet Take 200 mcg by mouth daily before breakfast.    [provider]  omeprazole (PRILOSEC) 20 MG capsule Take 20 mg by mouth daily.    [provider]  Potassium 99 MG TABS Take 1 tablet by mouth daily.    [provider]  potassium chloride SA (KLOR-CON) 20 MEQ tablet Take 1 tablet (20 mEq total) by mouth 2 (two) times daily for 7 days. 02/10/19 02/17/19   Sabas Sous, MD  ramipril (ALTACE) 10 MG capsule Take 10 mg by mouth daily.    [provider]    Allergies    Shellfish allergy  Review of Systems   Review of Systems  Constitutional: Negative for chills and fever.  HENT: Positive for congestion. Negative for ear pain and sore throat.   Respiratory: Positive for shortness of breath.   Cardiovascular: Negative for chest pain and leg swelling.  Gastrointestinal: Positive for nausea. Negative for abdominal pain, blood in stool, constipation, diarrhea and vomiting.  Genitourinary: Negative for dysuria.  Musculoskeletal: Positive for arthralgias and myalgias.  Neurological: Positive for dizziness and light-headedness. Negative for syncope, speech difficulty, weakness and numbness.  All other systems reviewed and are negative.   Physical Exam Updated Vital Signs BP (!) 137/93 (BP Location: Left Arm)   Pulse 100   Temp 99.8 F (37.7 C) (Oral)   Resp 17   Ht 5\' 3"  (1.6 m)   Wt 106.1 kg   LMP 01/23/2019   SpO2 99%   BMI 41.45 kg/m   Physical Exam Vitals and nursing note reviewed.  Constitutional:      General: She is not in acute distress.    Appearance: She is well-developed. She is not ill-appearing or toxic-appearing.  HENT:     Head: Normocephalic and atraumatic.     Right Ear: Ear canal normal. Tympanic membrane is not perforated, erythematous, retracted or bulging.     Left Ear: Ear canal normal. Tympanic membrane is not perforated, erythematous, retracted or bulging.     Ears:     Comments: No mastoid erythema/swelling/tenderness.     Nose:     Right Sinus: No maxillary sinus tenderness or frontal sinus tenderness.     Left Sinus: No maxillary sinus tenderness or frontal sinus tenderness.     Mouth/Throat:     Pharynx: Uvula midline. No oropharyngeal exudate or posterior oropharyngeal erythema.     Comments: Posterior oropharynx is symmetric appearing. Patient tolerating own secretions without difficulty.  No trismus. No drooling. No hot potato voice. No swelling beneath the tongue, submandibular compartment is soft.  Eyes:     General:        Right eye: No discharge.        Left eye: No discharge.     Conjunctiva/sclera: Conjunctivae normal.     Pupils: Pupils are equal, round, and reactive to light.  Cardiovascular:     Rate and Rhythm: Normal rate and regular rhythm.     Pulses:          Dorsalis pedis pulses are 2+ on the right side and 2+ on the left side.       Posterior tibial pulses are 2+ on the right side and 2+ on the left side.  Heart sounds: No murmur.  Pulmonary:     Effort: Pulmonary effort is normal. No respiratory distress.     Breath sounds: Normal breath sounds. No wheezing, rhonchi or rales.  Abdominal:     General: There is no distension.     Palpations: Abdomen is soft.     Tenderness: There is no abdominal tenderness. There is no guarding or rebound.  Musculoskeletal:     Cervical back: Normal range of motion and neck supple. No edema or rigidity.     Comments: Lower extremities: No obvious deformity, appreciable swelling, edema, erythema, ecchymosis, warmth, or open wounds. Patient has intact AROM to bilateral hips, knees, ankles, and all digits.  Diffusely tender to palpation throughout bilateral lower extremities including the hips, thighs, knees, calves, and ankles.  No point/focal bony tenderness.  Compartments are soft.  Lymphadenopathy:     Cervical: No cervical adenopathy.  Skin:    General: Skin is warm and dry.     Capillary Refill: Capillary refill takes less than 2 seconds.     Findings: No rash.  Neurological:     Mental Status: She is alert.     Comments: Alert. Clear speech. Sensation grossly intact to bilateral lower extremities. 5/5 strength with plantar/dorsiflexion bilaterally. Patient ambulatory.   Psychiatric:        Mood and Affect: Mood normal.        Behavior: Behavior normal.     ED Results / Procedures / Treatments   Labs (all  labs ordered are listed, but only abnormal results are displayed) Labs Reviewed  COMPREHENSIVE METABOLIC PANEL - Abnormal; Notable for the following components:      Result Value   Potassium 3.0 (*)    Glucose, Bld 162 (*)    Calcium 8.8 (*)    All other components within normal limits  CBC WITH DIFFERENTIAL/PLATELET - Abnormal; Notable for the following components:   WBC 11.1 (*)    Hemoglobin 9.6 (*)    HCT 34.5 (*)    MCV 69.3 (*)    MCH 19.3 (*)    MCHC 27.8 (*)    RDW 22.7 (*)    All other components within normal limits  URINALYSIS, ROUTINE W REFLEX MICROSCOPIC - Abnormal; Notable for the following components:   Hgb urine dipstick MODERATE (*)    Protein, ur 30 (*)    All other components within normal limits  MAGNESIUM  TSH  I-STAT BETA HCG BLOOD, ED ([facility], [facility], [facility] ONLY)    EKG EKG Interpretation  Date/Time:  Wednesday February 17 2019 15:41:31 EST Ventricular Rate:  97 PR Interval:  146 QRS Duration: 78 QT Interval:  386 QTC Calculation: 490 R Axis:   17 Text Interpretation: Normal sinus rhythm Septal infarct , age undetermined Abnormal ECG Confirmed by Marianna Fuss ([id number]) on 02/17/2019 8:59:58 PM   Radiology DG Chest 2 View  Result Date: 02/17/2019 CLINICAL DATA:  Cough, shortness of breath and extremity pain. EXAM: CHEST - 2 VIEW COMPARISON:  February 10, 2019 FINDINGS: There is no evidence of acute infiltrate, pleural effusion or pneumothorax. The heart size and mediastinal contours are within normal limits. Multiple radiopaque surgical clips are seen along the midline of the thoracic inlet. The visualized skeletal structures are unremarkable. IMPRESSION: No active cardiopulmonary disease. Electronically Signed   By: Aram Candela M.D.   On: 02/17/2019 16:23    Procedures Procedures (including critical care time)  Medications Ordered in ED Medications - No data to display  ED Course  I  have reviewed the triage vital signs and the nursing  notes.  Pertinent labs & imaging results that were available during my care of the patient were reviewed by me and considered in my medical decision making (see chart for details).    MDM Rules/Calculators/A&P                      Patient presents to the emergency department with complaints of intermittent lightheadedness today as well as acute on chronic bilateral lower extremity pain.  Patient is nontoxic-appearing, resting comfortably, vitals without significant abnormality, BP somewhat elevated, doubt HTN emergency.  Pregnancy test: Negative CBC: Mild leukocytosis felt to be nonspecific.  Hemoglobin/hematocrit fairly similar to prior. CMP: Mild hypokalemia, improved from prior, oral replacement.  Mild hypocalcemia.  Hyperglycemia without acidosis or anion gap elevation. Magnesium: WNL TSH: WNL Urinalysis: Hematuria, PCP follow-up. EKG: Sinus rhythm, no STEMI, no chest pain CXR: No active cardiopulmonary disease. Orthostatics without concerning changes.  Overall reassuring work-up in the emergency department. Lower extremities are without erythema, warmth, or open wounds, findings are not consistent with infectious process such as septic joint, cellulitis, or osteomyelitis.  Given symptoms are bilateral, no edema, feel that DVT is unlikely.  She is neurovascularly intact distally.  She states this pain is acute on chronic pain related to her rheumatoid arthritis.  Joints are overall well-appearing with good range of motion.  Regard to her lightheadedness, cardiac monitor reassuring, orthostatics reassuring, labs without significant abnormalities when compared to prior.  She has had some mild pain relief in the emergency department.  She will need to follow-up with her pain management doctor.  Will provide information for local pain management, rheumatology, & primary care.  We will try nonnarcotic medications as alternatives to go home with.I discussed results, treatment plan, need for  follow-up, and return precautions with the patient. Provided opportunity for questions, patient confirmed understanding and is in agreement with plan.   Findings and plan of care discussed with supervising physician Dr. Stevie Kern who is in agreement.   Final Clinical Impression(s) / ED Diagnoses Final diagnoses:  Pain in both lower extremities  Lightheadedness    Rx / DC Orders ED Discharge Orders         Ordered    naproxen (NAPROSYN) 500 MG tablet  2 times daily     02/17/19 2107    methocarbamol (ROBAXIN) 500 MG tablet  Every 8 hours PRN     02/17/19 2107    potassium chloride SA (KLOR-CON) 20 MEQ tablet  Daily     02/17/19 2107           Desmond Lope 02/17/19 2108    Milagros Loll, MD 02/18/19 1253

## 2019-03-07 ENCOUNTER — Other Ambulatory Visit: Payer: Self-pay

## 2019-03-07 ENCOUNTER — Encounter (HOSPITAL_COMMUNITY): Payer: Self-pay

## 2019-03-07 ENCOUNTER — Emergency Department (HOSPITAL_COMMUNITY)
Admission: EM | Admit: 2019-03-07 | Discharge: 2019-03-07 | Disposition: A | Discharge status: Home / Self Care | Payer: Medicaid Other | Attending: Emergency Medicine | Admitting: Emergency Medicine

## 2019-03-07 ENCOUNTER — Emergency Department (HOSPITAL_COMMUNITY): Payer: Medicaid Other

## 2019-03-07 DIAGNOSIS — E876 Hypokalemia: Secondary | ICD-10-CM | POA: Insufficient documentation

## 2019-03-07 DIAGNOSIS — M25561 Pain in right knee: Secondary | ICD-10-CM | POA: Insufficient documentation

## 2019-03-07 DIAGNOSIS — R110 Nausea: Secondary | ICD-10-CM | POA: Insufficient documentation

## 2019-03-07 DIAGNOSIS — R05 Cough: Secondary | ICD-10-CM | POA: Diagnosis not present

## 2019-03-07 DIAGNOSIS — J45909 Unspecified asthma, uncomplicated: Secondary | ICD-10-CM | POA: Insufficient documentation

## 2019-03-07 DIAGNOSIS — R0602 Shortness of breath: Secondary | ICD-10-CM | POA: Diagnosis not present

## 2019-03-07 DIAGNOSIS — Z79899 Other long term (current) drug therapy: Secondary | ICD-10-CM | POA: Insufficient documentation

## 2019-03-07 DIAGNOSIS — G8929 Other chronic pain: Secondary | ICD-10-CM | POA: Insufficient documentation

## 2019-03-07 DIAGNOSIS — F17210 Nicotine dependence, cigarettes, uncomplicated: Secondary | ICD-10-CM | POA: Diagnosis not present

## 2019-03-07 DIAGNOSIS — I10 Essential (primary) hypertension: Secondary | ICD-10-CM | POA: Insufficient documentation

## 2019-03-07 DIAGNOSIS — Z20822 Contact with and (suspected) exposure to covid-19: Secondary | ICD-10-CM | POA: Diagnosis not present

## 2019-03-07 DIAGNOSIS — E119 Type 2 diabetes mellitus without complications: Secondary | ICD-10-CM | POA: Diagnosis not present

## 2019-03-07 DIAGNOSIS — Z7984 Long term (current) use of oral hypoglycemic drugs: Secondary | ICD-10-CM | POA: Diagnosis not present

## 2019-03-07 DIAGNOSIS — M25562 Pain in left knee: Secondary | ICD-10-CM | POA: Insufficient documentation

## 2019-03-07 DIAGNOSIS — R0789 Other chest pain: Secondary | ICD-10-CM | POA: Diagnosis not present

## 2019-03-07 LAB — URINALYSIS, ROUTINE W REFLEX MICROSCOPIC
Bacteria, UA: NONE SEEN
Bilirubin Urine: NEGATIVE
Glucose, UA: NEGATIVE mg/dL
Ketones, ur: NEGATIVE mg/dL
Leukocytes,Ua: NEGATIVE
Nitrite: NEGATIVE
Protein, ur: 30 mg/dL — AB
Specific Gravity, Urine: 1.033 — ABNORMAL HIGH (ref 1.005–1.030)
pH: 5 (ref 5.0–8.0)

## 2019-03-07 LAB — BASIC METABOLIC PANEL
Anion gap: 14 (ref 5–15)
BUN: 15 mg/dL (ref 6–20)
CO2: 26 mmol/L (ref 22–32)
Calcium: 10.5 mg/dL — ABNORMAL HIGH (ref 8.9–10.3)
Chloride: 98 mmol/L (ref 98–111)
Creatinine, Ser: 0.84 mg/dL (ref 0.44–1.00)
GFR calc Af Amer: >60 mL/min (ref >60)
GFR calc non Af Amer: >60 mL/min (ref >60)
Glucose, Bld: 109 mg/dL — ABNORMAL HIGH (ref 70–99)
Potassium: 2.9 mmol/L — ABNORMAL LOW (ref 3.5–5.1)
Sodium: 138 mmol/L (ref 135–145)

## 2019-03-07 LAB — RESPIRATORY PANEL BY RT PCR (FLU A&B, COVID)
Influenza A by PCR: NEGATIVE
Influenza B by PCR: NEGATIVE
SARS Coronavirus 2 by RT PCR: NEGATIVE

## 2019-03-07 LAB — CBC WITH DIFFERENTIAL/PLATELET
Abs Immature Granulocytes: 0.04 10*3/uL (ref 0.00–0.07)
Basophils Absolute: 0 10*3/uL (ref 0.0–0.1)
Basophils Relative: 0 %
Eosinophils Absolute: 0.2 10*3/uL (ref 0.0–0.5)
Eosinophils Relative: 2 %
HCT: 36.8 % (ref 36.0–46.0)
Hemoglobin: 10.7 g/dL — ABNORMAL LOW (ref 12.0–15.0)
Immature Granulocytes: 0 %
Lymphocytes Relative: 26 %
Lymphs Abs: 3.4 10*3/uL (ref 0.7–4.0)
MCH: 20 pg — ABNORMAL LOW (ref 26.0–34.0)
MCHC: 29.1 g/dL — ABNORMAL LOW (ref 30.0–36.0)
MCV: 68.7 fL — ABNORMAL LOW (ref 80.0–100.0)
Monocytes Absolute: 0.6 10*3/uL (ref 0.1–1.0)
Monocytes Relative: 5 %
Neutro Abs: 8.8 10*3/uL — ABNORMAL HIGH (ref 1.7–7.7)
Neutrophils Relative %: 67 %
Platelets: 332 10*3/uL (ref 150–400)
RBC: 5.36 MIL/uL — ABNORMAL HIGH (ref 3.87–5.11)
RDW: 23.2 % — ABNORMAL HIGH (ref 11.5–15.5)
WBC: 13.1 10*3/uL — ABNORMAL HIGH (ref 4.0–10.5)
nRBC: 0 % (ref 0.0–0.2)

## 2019-03-07 LAB — PREGNANCY, URINE: Preg Test, Ur: NEGATIVE

## 2019-03-07 MED ORDER — IBUPROFEN 400 MG PO TABS
600.0000 mg | ORAL_TABLET | Freq: Once | ORAL | Status: AC
  Administered 2019-03-07: 16:00:00 600 mg via ORAL
  Filled 2019-03-07: qty 1

## 2019-03-07 MED ORDER — SODIUM CHLORIDE 0.9 % IV BOLUS
1000.0000 mL | Freq: Once | INTRAVENOUS | Status: AC
  Administered 2019-03-07: 1000 mL via INTRAVENOUS

## 2019-03-07 MED ORDER — POTASSIUM CHLORIDE CRYS ER 20 MEQ PO TBCR
40.0000 meq | EXTENDED_RELEASE_TABLET | Freq: Once | ORAL | Status: AC
  Administered 2019-03-07: 40 meq via ORAL
  Filled 2019-03-07: qty 2

## 2019-03-07 MED ORDER — GUAIFENESIN ER 1200 MG PO TB12: 1.0000 | ORAL_TABLET | Freq: Two times a day (BID) | ORAL | 0 refills | Status: DC

## 2019-03-07 MED ORDER — PROMETHAZINE HCL 25 MG PO TABS: 25.0000 mg | ORAL_TABLET | Freq: Four times a day (QID) | ORAL | 0 refills | Status: DC | PRN

## 2019-03-07 MED ORDER — ACETAMINOPHEN-CODEINE 120-12 MG/5ML PO SOLN: 10.0000 mL | ORAL | 0 refills | Status: DC | PRN

## 2019-03-07 MED ORDER — PROMETHAZINE HCL 25 MG/ML IJ SOLN
25.0000 mg | Freq: Once | INTRAMUSCULAR | Status: AC
  Administered 2019-03-07: 25 mg via INTRAVENOUS
  Filled 2019-03-07: qty 1

## 2019-03-07 MED ORDER — MORPHINE SULFATE (PF) 4 MG/ML IV SOLN
4.0000 mg | Freq: Once | INTRAVENOUS | Status: AC
  Administered 2019-03-07: 4 mg via INTRAVENOUS
  Filled 2019-03-07: qty 1

## 2019-03-07 NOTE — ED Provider Notes (Signed)
MSE was initiated and I personally evaluated the patient and placed orders (if any) at  4:01 PM on March 07, 2019.  Patient is a 44 year old female with history of Graves' disease s/p thyroidectomy, diabetes, anemia, hypertension, asthma  Patient also has a history of chronic bilateral lower extremity pain/knee plain and is on pain management for this she normally takes 10 mg of immediate release oxycodone 3 times daily as well as 10 mg of extended release oxycodone twice daily.  She states that she has been out of her medications for almost a month now.  She moved recently from [place] and states that she has not been able to get established with pain management down here.   Patient states over the past several months she has had 3 episodes of viral illnesses.  She was last seen 02/17/2019 for lower extremity pain and lightheadedness.  She was told to follow-up with pain management but she has not been able to do so yet.  She is complaining today of cough and fever for the past week.  Patient states she took Tylenol this morning but has taken no medications since that time.  She endorses dry cough, shortness of breath, chest pain when coughing but no other chest pain--no exertional chest pain or radiation of chest pain.  She states she is also here for bilateral knee pain which is chronic in nature.  She is afebrile and mildly tachycardic at 110 on my examination.  She has no abdominal tenderness, lungs are clear to auscultation bilaterally, legs are symmetric and appear swollen.  We will order BMP, CBC U pregnant and urinalysis patient states that she has been urinating more frequently as well as well as rapid antigen test for Covid.  Will also order chest x-ray to evaluate for pneumonia.  Will provide patient with 1 L normal saline because of her tachycardia as well as ibuprofen and morphine.    Anticipate discharge from ED after labs and rehydration/pain control.  Will not place an order for lower  extremity ultrasound at this time as I do not think that this is an acute problem.  I suspect that this is chronic pain related osteoarthritis or other chronic condition.  The patient appears stable so that the remainder of the MSE may be completed by another provider.   Solon Augusta [place], [place] 03/07/19 1608    Arby Barrette, MD 03/08/19 [phone]

## 2019-03-07 NOTE — ED Triage Notes (Signed)
Patient complains of ongoing body aches, dry cough and intermittent fever x 6 weeks. Seen at [facility] ED a few weeks ago for same. Alert and oriented, NAD. Had tylenol 0900

## 2019-03-07 NOTE — ED Notes (Signed)
Patient called out stating she was nauseated.  PA notified

## 2019-03-07 NOTE — ED Provider Notes (Signed)
[facility] EMERGENCY DEPARTMENT Provider Note   CSN: [id number] Arrival date & time: 03/07/19  1438     History No chief complaint on file.   Kristin Hurst is a 44 y.o. female.  HPI Patient presents to the emergency department with cough with dry throat and chronic knee pain and nausea over the last week.  Patient states that the symptoms have been evaluated twice over the last week.  States that she has had negative Covid testing.  Patient states that she did not take any medications prior to arrival for her symptoms.  Patient states that nothing seems to make the condition better or worse.  The patient denies chest pain, shortness of breath, headache,blurred vision, neck pain,  weakness, numbness, dizziness, anorexia, edema, abdominal pain, vomiting, diarrhea, rash, back pain, dysuria, hematemesis, bloody stool, near syncope, or syncope.    Past Medical History:  Diagnosis Date  . Anemia   . Asthma   . Diabetes mellitus without complication (HCC)   . Graves disease   . Hypertension   . Thyroid disease     There are no problems to display for this patient.   Past Surgical History:  Procedure Laterality Date  . THYROIDECTOMY       OB History   No obstetric history on file.     Family History  Problem Relation Age of Onset  . Heart failure Mother   . Diabetes Mother     Social History   Tobacco Use  . Smoking status: Current Every Day Smoker    Packs/day: 0.50    Types: Cigarettes  . Smokeless tobacco: Never Used  Substance Use Topics  . Alcohol use: Yes  . Drug use: Never    Home Medications Prior to Admission medications   Medication Sig Start Date End Date Taking? Authorizing Provider  acetaminophen (TYLENOL) 500 MG tablet Take 1,000 mg by mouth every 6 (six) hours as needed for moderate pain.    [provider]  albuterol (VENTOLIN HFA) 108 (90 Base) MCG/ACT inhaler Inhale 1-2 puffs into the lungs every 6 (six) hours as  needed for wheezing or shortness of breath.    [provider]  amLODipine (NORVASC) 10 MG tablet Take 10 mg by mouth daily.    [provider]  azelastine (OPTIVAR) 0.05 % ophthalmic solution Place 1 drop into both eyes daily.    [provider]  calcium carbonate (OS-CAL - DOSED IN MG OF ELEMENTAL CALCIUM) 1250 (500 Ca) MG tablet Take 1 tablet by mouth daily.    [provider]  Dulaglutide (TRULICITY) 0.75 MG/0.5ML SOPN Inject 0.75 mg into the skin once a week.    [provider]  DULoxetine (CYMBALTA) 20 MG capsule Take 20 mg by mouth every evening.    [provider]  ferrous sulfate 325 (65 FE) MG tablet Take 325 mg by mouth daily with breakfast.    [provider]  levothyroxine (SYNTHROID) 200 MCG tablet Take 200 mcg by mouth daily before breakfast.    [provider]  methocarbamol (ROBAXIN) 500 MG tablet Take 1 tablet (500 mg total) by mouth every 8 (eight) hours as needed. 02/17/19   Petrucelli, Samantha R, PA-C  naproxen (NAPROSYN) 500 MG tablet Take 1 tablet (500 mg total) by mouth 2 (two) times daily. 02/17/19   Petrucelli, Samantha R, PA-C  omeprazole (PRILOSEC) 20 MG capsule Take 20 mg by mouth daily.    [provider]  Potassium 99 MG TABS Take 1  tablet by mouth daily.    [provider]  potassium chloride SA (KLOR-CON) 20 MEQ tablet Take 1 tablet (20 mEq total) by mouth daily. 02/17/19   Petrucelli, Samantha R, PA-C  ramipril (ALTACE) 10 MG capsule Take 10 mg by mouth daily.    [provider]    Allergies    Shellfish allergy  Review of Systems   Review of Systems All other systems negative except as documented in the HPI. All pertinent positives and negatives as reviewed in the HPI. Physical Exam Updated Vital Signs BP 137/73   Pulse 91   Temp 98.3 F (36.8 C) (Oral)   Resp 19   SpO2 97%   Physical Exam Vitals and nursing note reviewed.  Constitutional:      General:  She is not in acute distress.    Appearance: She is well-developed.  HENT:     Head: Normocephalic and atraumatic.  Eyes:     Pupils: Pupils are equal, round, and reactive to light.  Cardiovascular:     Rate and Rhythm: Normal rate and regular rhythm.     Heart sounds: Normal heart sounds. No murmur. No friction rub. No gallop.   Pulmonary:     Effort: Pulmonary effort is normal. No respiratory distress.     Breath sounds: Normal breath sounds. No wheezing.  Abdominal:     General: Bowel sounds are normal. There is no distension.     Palpations: Abdomen is soft.     Tenderness: There is no abdominal tenderness.  Musculoskeletal:     Cervical back: Normal range of motion and neck supple.  Skin:    General: Skin is warm and dry.     Capillary Refill: Capillary refill takes less than 2 seconds.     Findings: No erythema or rash.  Neurological:     Mental Status: She is alert and oriented to person, place, and time.     Motor: No abnormal muscle tone.     Coordination: Coordination normal.  Psychiatric:        Behavior: Behavior normal.     ED Results / Procedures / Treatments   Labs (all labs ordered are listed, but only abnormal results are displayed) Labs Reviewed  URINALYSIS, ROUTINE W REFLEX MICROSCOPIC - Abnormal; Notable for the following components:      Result Value   APPearance HAZY (*)    Specific Gravity, Urine 1.033 (*)    Hgb urine dipstick SMALL (*)    Protein, ur 30 (*)    All other components within normal limits  CBC WITH DIFFERENTIAL/PLATELET - Abnormal; Notable for the following components:   WBC 13.1 (*)    RBC 5.36 (*)    Hemoglobin 10.7 (*)    MCV 68.7 (*)    MCH 20.0 (*)    MCHC 29.1 (*)    RDW 23.2 (*)    Neutro Abs 8.8 (*)    All other components within normal limits  BASIC METABOLIC PANEL - Abnormal; Notable for the following components:   Potassium 2.9 (*)    Glucose, Bld 109 (*)    Calcium 10.5 (*)    All other components within normal  limits  RESPIRATORY PANEL BY RT PCR (FLU A&B, COVID)  PREGNANCY, URINE  POC SARS CORONAVIRUS 2 AG -  ED    EKG None  Radiology DG Chest Portable 1 View  Result Date: 03/07/2019 CLINICAL DATA:  Cough, short of breath EXAM: PORTABLE CHEST 1 VIEW COMPARISON:  02/17/2019 FINDINGS: The  heart size and mediastinal contours are within normal limits. No focal consolidation or pleural effusion. Minimal basilar atelectasis. Stable cardiomediastinal silhouette. No pneumothorax. Postsurgical changes at the thoracic inlet. IMPRESSION: No active disease. Electronically Signed   By: Jasmine Pang M.D.   On: 03/07/2019 16:10    Procedures Procedures (including critical care time)  Medications Ordered in ED Medications  ibuprofen (ADVIL) tablet 600 mg (600 mg Oral Given 03/07/19 1621)  sodium chloride 0.9 % bolus 1,000 mL (0 mLs Intravenous Stopped 03/07/19 1645)  morphine 4 MG/ML injection 4 mg (4 mg Intravenous Given 03/07/19 1622)  morphine 4 MG/ML injection 4 mg (4 mg Intravenous Given 03/07/19 1720)  potassium chloride SA (KLOR-CON) CR tablet 40 mEq (40 mEq Oral Given 03/07/19 1932)  promethazine (PHENERGAN) injection 25 mg (25 mg Intravenous Given 03/07/19 1931)  morphine 4 MG/ML injection 4 mg (4 mg Intravenous Given 03/07/19 2027)    ED Course  I have reviewed the triage vital signs and the nursing notes.  Pertinent labs & imaging results that were available during my care of the patient were reviewed by me and considered in my medical decision making (see chart for details).    MDM Rules/Calculators/A&P                      Patient will be treated for possible viral upper respiratory infectious process patient has had 3 - Covid tests over the last 2 weeks.  The patient is advised to follow-up with a primary care.  Patient be given symptomatic treatment for her issues.  Told to return here as needed.  Patient's vital signs have improved following IV fluids. Final Clinical Impression(s) / ED  Diagnoses Final diagnoses:  None    Rx / DC Orders ED Discharge Orders    None       Kyra Manges 03/07/19 2035    Arby Barrette, MD 03/08/19 [phone]

## 2019-03-07 NOTE — Discharge Instructions (Addendum)
Return here as needed.  Follow-up with a primary care provider.  Increase your fluid intake.

## 2019-03-08 ENCOUNTER — Telehealth: Payer: Self-pay | Admitting: *Deleted

## 2019-03-08 NOTE — Telephone Encounter (Signed)
Patient given contact information for [facility], [facility] ,and also the patient care center to make an appointment.

## 2019-03-10 ENCOUNTER — Telehealth: Payer: Self-pay | Admitting: Nurse Practitioner

## 2019-03-10 ENCOUNTER — Other Ambulatory Visit: Payer: Self-pay

## 2019-03-10 ENCOUNTER — Ambulatory Visit (INDEPENDENT_AMBULATORY_CARE_PROVIDER_SITE_OTHER): Payer: Self-pay | Admitting: Nurse Practitioner

## 2019-03-10 ENCOUNTER — Encounter: Payer: Self-pay | Admitting: Nurse Practitioner

## 2019-03-10 VITALS — BP 135/97 | HR 96 | Temp 98.1°F | Resp 16 | Ht 63.0 in | Wt 230.0 lb

## 2019-03-10 DIAGNOSIS — Z716 Tobacco abuse counseling: Secondary | ICD-10-CM

## 2019-03-10 DIAGNOSIS — R7303 Prediabetes: Secondary | ICD-10-CM

## 2019-03-10 DIAGNOSIS — E0500 Thyrotoxicosis with diffuse goiter without thyrotoxic crisis or storm: Secondary | ICD-10-CM

## 2019-03-10 DIAGNOSIS — E876 Hypokalemia: Secondary | ICD-10-CM

## 2019-03-10 DIAGNOSIS — E038 Other specified hypothyroidism: Secondary | ICD-10-CM

## 2019-03-10 DIAGNOSIS — Z23 Encounter for immunization: Secondary | ICD-10-CM

## 2019-03-10 DIAGNOSIS — I10 Essential (primary) hypertension: Secondary | ICD-10-CM

## 2019-03-10 DIAGNOSIS — D72828 Other elevated white blood cell count: Secondary | ICD-10-CM

## 2019-03-10 DIAGNOSIS — R197 Diarrhea, unspecified: Secondary | ICD-10-CM

## 2019-03-10 DIAGNOSIS — G894 Chronic pain syndrome: Secondary | ICD-10-CM

## 2019-03-10 DIAGNOSIS — D508 Other iron deficiency anemias: Secondary | ICD-10-CM

## 2019-03-10 DIAGNOSIS — J45909 Unspecified asthma, uncomplicated: Secondary | ICD-10-CM

## 2019-03-10 LAB — POCT URINALYSIS DIPSTICK
Bilirubin, UA: NEGATIVE
Glucose, UA: NEGATIVE
Ketones, UA: NEGATIVE
Leukocytes, UA: NEGATIVE
Nitrite, UA: NEGATIVE
Protein, UA: NEGATIVE
Spec Grav, UA: 1.02 (ref 1.010–1.025)
Urobilinogen, UA: 0.2 E.U./dL
pH, UA: 6.5 (ref 5.0–8.0)

## 2019-03-10 LAB — POCT GLYCOSYLATED HEMOGLOBIN (HGB A1C): Hemoglobin A1C: 5.9 % — AB (ref 4.0–5.6)

## 2019-03-10 MED ORDER — PANTOPRAZOLE SODIUM 40 MG PO TBEC: 40.0000 mg | DELAYED_RELEASE_TABLET | Freq: Every day | ORAL | 0 refills | Status: DC

## 2019-03-10 MED ORDER — AMLODIPINE BESYLATE 10 MG PO TABS: 10.0000 mg | ORAL_TABLET | Freq: Every day | ORAL | 0 refills | Status: AC

## 2019-03-10 MED ORDER — NAPROXEN 500 MG PO TABS: 500.0000 mg | ORAL_TABLET | Freq: Two times a day (BID) | ORAL | 0 refills | Status: DC

## 2019-03-10 MED ORDER — LEVOTHYROXINE SODIUM 200 MCG PO TABS: 200.0000 ug | ORAL_TABLET | Freq: Every day | ORAL | 0 refills | Status: DC

## 2019-03-10 MED ORDER — OXYCODONE HCL ER 10 MG PO T12A: 10.0000 mg | EXTENDED_RELEASE_TABLET | Freq: Two times a day (BID) | ORAL | 0 refills | Status: DC

## 2019-03-10 MED ORDER — METHOCARBAMOL 500 MG PO TABS: 500.0000 mg | ORAL_TABLET | Freq: Three times a day (TID) | ORAL | 0 refills | Status: AC | PRN

## 2019-03-10 MED ORDER — OXYCODONE HCL 10 MG PO TABS: 10.0000 mg | ORAL_TABLET | Freq: Three times a day (TID) | ORAL | 0 refills | Status: AC

## 2019-03-10 MED ORDER — RAMIPRIL 10 MG PO CAPS: 10.0000 mg | ORAL_CAPSULE | Freq: Every day | ORAL | 0 refills | Status: DC

## 2019-03-10 MED FILL — NAPROXEN 500 MG TABLET: 500 | 5 days supply | Qty: 10 | Fill #0

## 2019-03-10 MED FILL — RAMIPRIL 10 MG CAPSULE: 10 | 30 days supply | Qty: 30 | Fill #0

## 2019-03-10 MED FILL — PANTOPRAZOLE SOD DR 40 MG T: 40 | 30 days supply | Qty: 30 | Fill #0

## 2019-03-10 MED FILL — METHOCARBAMOL 500 MG TABS: 500 | 20 days supply | Qty: 60 | Fill #0

## 2019-03-10 MED FILL — AMLODIPINE BESYLATE 10 MG T: 10 | 30 days supply | Qty: 30 | Fill #0

## 2019-03-10 MED FILL — LEVOTHYROXINE 200 MCG TAB: 200 | 30 days supply | Qty: 30 | Fill #0

## 2019-03-10 NOTE — Progress Notes (Signed)
New Patient Office Visit  Subjective:  Patient ID: Kristin Hurst, female    DOB: 18-Jun-1975  Age: 44 y.o. MRN: [id number]  CC:  Chief Complaint  Patient presents with  . Establish Care  . Hypothyroidism  . Gastroesophageal Reflux  . Asthma  . Joint Pain    HPI Kristin Hurst presents to establish care.  She is a previous resident of [place] moved here October 2020.  She has a history of hypertension, Graves' disease; post thyroidectomy, hypothyroidism, prediabetes, asthma GERD,anemia rheumatoid arthritis, fibromyalgia and chronic pain.  She has been evaluated in the emergency room on several occasions for multiple issues.  She was COV 19 negative. Her current complaint is a dry debilitating cough.  She does feel like the cough had resolved for several months but has returned.  The cough does bring chest tightness.  Her temperature yesterday was 101.  She denies any chills, shortness of breath or dyspnea on exertion or wheezing..  She is using her albuterol inhaler as directed along with the albuterol nebulizer.  She has been feeling somewhat sluggish and tired.  On January 20 she was diagnosed with hypokalemia. She did receive a potassium infusion at that time. She denies any current nausea or vomiting.  She admits that she is having diarrhea.  She has been having problems with diarrhea for the last 5 years.  She admits that it comes and goes.  She describes it as liquidy stools.  Her last ER visit on February 14 she was given oral potassium.  She is 44 yrs old, however admits that she did have a colonoscopy some years ago.  She admits this was a recommendation from her GYN in [place].  She does have a history of hemorrhoids with bleeding.   She also has heavy cycles that are painful and wants previously diagnosed with fibroids.  However on last year was told she has no fibroids.  She has had iron infusions in the past.  She does not feel that oral iron is effective.    She has had  elevated WBC on her previous ER visits February 14.  She denies antibiotic treatment.  She has a history of Graves' disease status post thyroidectomy October 2018.  She is currently on Synthroid 200 mcg daily.  She admits the generic levothyroxine is not effective.  She is also complaining of dry mouth and frequent urination.  She denies any hesitancy, hematuria, dysuria or nocturia.  She was previously diagnosed with prediabetes.  She was taken off of her Metformin by endocrinology because of the potential side effects.  She was later started on Trulicity for A1c of 7% by her primary care provider.  She has been off of this for several months.  She also has a history of rheumatoid arthritis and fibromyalgia.  She admits that she has difficulty walking at times.  She has had swelling in her legs with pain.  She denied any shortness of breath or chest pain at that time.  This did resolve on its own.    She has chronic back pain currently on Cymbalta daily, Xtampza ER 10 mg twice daily and oxycodone 10 mg 3 times daily.  She is failed Butrans patches.  She admits that she was diagnosed with degenerative disc disease.  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     Past Medical History:  Diagnosis Date  . Anemia   . Asthma   . Diabetes  mellitus without complication ([place])   . Graves disease   . Hypertension   . Thyroid disease     Past Surgical History:  Procedure Laterality Date  . THYROIDECTOMY      Family History  Problem Relation Age of Onset  . Heart failure Mother   . Diabetes Mother     Social History   Socioeconomic History  . Marital status: Single    Spouse name: Not on file  . Number of children: Not on file  . Years of education: Not on file  . Highest education level: Not on file  Occupational History  . Not on file  Tobacco Use  . Smoking status: Current Every Day Smoker    Packs/day: 0.25    Types: Cigarettes  . Smokeless  tobacco: Never Used  Substance and Sexual Activity  . Alcohol use: Yes    Comment: social   . Drug use: Never  . Sexual activity: Yes  Other Topics Concern  . Not on file  Social History Narrative  . Not on file   Social Determinants of Health   Financial Resource Strain:   . Difficulty of Paying Living Expenses: Not on file  Food Insecurity:   . Worried About Charity fundraiser in the Last Year: Not on file  . Ran Out of Food in the Last Year: Not on file  Transportation Needs:   . Lack of Transportation (Medical): Not on file  . Lack of Transportation (Non-Medical): Not on file  Physical Activity:   . Days of Exercise per Week: Not on file  . Minutes of Exercise per Session: Not on file  Stress:   . Feeling of Stress : Not on file  Social Connections:   . Frequency of Communication with Friends and Family: Not on file  . Frequency of Social Gatherings with Friends and Family: Not on file  . Attends Religious Services: Not on file  . Active Member of Clubs or Organizations: Not on file  . Attends Archivist Meetings: Not on file  . Marital Status: Not on file  Intimate Partner Violence:   . Fear of Current or Ex-Partner: Not on file  . Emotionally Abused: Not on file  . Physically Abused: Not on file  . Sexually Abused: Not on file    ROS Review of Systems  Constitutional: Positive for appetite change and unexpected weight change.       Temperature on 03/09/19 101.0 Gradual weight gain  Eyes: Negative.   Respiratory: Positive for cough and chest tightness.   Cardiovascular: Negative.   Gastrointestinal: Positive for diarrhea, nausea and rectal pain.  Endocrine: Negative.   Genitourinary: Positive for frequency.  Musculoskeletal: Positive for myalgias.       Generalized  Hx of back pain but none today   Skin: Negative.   Allergic/Immunologic: Negative.   Neurological: Negative.   Hematological: Bruises/bleeds easily.  Psychiatric/Behavioral:  Negative.     Objective:   Today's Vitals: BP (!) 135/97 (BP Location: Right Arm, Patient Position: Sitting, Cuff Size: Normal)   Pulse 96   Temp 98.1 F (36.7 C) (Oral)   Resp 16   Ht 5\' 3"  (1.6 m)   Wt 230 lb (104.3 kg)   LMP 02/27/2019   SpO2 98%   BMI 40.74 kg/m   Physical Exam Constitutional:      Appearance: She is obese.  HENT:     Head: Normocephalic.     Nose: Nose normal.     Mouth/Throat:  Mouth: Mucous membranes are moist.     Pharynx: Oropharynx is clear.  Neck:     Comments: Well-healed surgical scar Cardiovascular:     Rate and Rhythm: Normal rate and regular rhythm.     Pulses: Normal pulses.  Pulmonary:     Effort: Pulmonary effort is normal.     Breath sounds: Normal breath sounds.  Abdominal:     Comments: Obese hypoactive  Musculoskeletal:     Cervical back: Normal range of motion.     Comments: Guarding with lying  Skin:    General: Skin is warm and dry.  Neurological:     General: No focal deficit present.     Mental Status: She is alert and oriented to person, place, and time.  Psychiatric:        Mood and Affect: Mood normal.        Behavior: Behavior normal.        Thought Content: Thought content normal.        Judgment: Judgment normal.     Assessment & Plan:   Problem List Items Addressed This Visit    None    Visit Diagnoses    Hypertension, unspecified type    -  Primary   Relevant Medications   ramipril (ALTACE) 10 MG capsule   amLODipine (NORVASC) 10 MG tablet   Other Relevant Orders   Urinalysis Dipstick (Completed)   Pre-diabetes       Relevant Orders   HgB A1c (Completed)   Urinalysis Dipstick (Completed)   Tobacco abuse counseling       Asthma, unspecified asthma severity, unspecified whether complicated, unspecified whether persistent       Relevant Medications   albuterol (2.5 MG/3ML) 0.083% NEBU 3 mL, albuterol (5 MG/ML) 0.5% NEBU 0.5 mL   Graves disease       Relevant Medications   levothyroxine  (SYNTHROID) 200 MCG tablet   Other specified hypothyroidism       Relevant Medications   levothyroxine (SYNTHROID) 200 MCG tablet   Hypokalemia       Relevant Orders   Comp. Metabolic Panel (12) (Completed)   Other iron deficiency anemia       Relevant Orders   CBC with Differential/Platelet (Completed)   Chronic pain syndrome       Relevant Medications   naproxen (NAPROSYN) 500 MG tablet   methocarbamol (ROBAXIN) 500 MG tablet   Oxycodone HCl 10 MG TABS   oxyCODONE (OXYCONTIN) 10 mg 12 hr tablet   Other Relevant Orders   Ambulatory referral to Pain Clinic   Diarrhea, unspecified type       Relevant Orders   Cdiff NAA+O+P+Stool Culture   Other elevated white blood cell (WBC) count       Relevant Orders   CBC with Differential/Platelet (Completed)      Outpatient Encounter Medications as of 03/10/2019  Medication Sig  . acetaminophen (TYLENOL) 500 MG tablet Take 1,000 mg by mouth every 6 (six) hours as needed for moderate pain.  Marland Kitchen acetaminophen-codeine 120-12 MG/5ML solution Take 10 mLs by mouth every 4 (four) hours as needed for moderate pain (and cough).  Marland Kitchen albuterol (2.5 MG/3ML) 0.083% NEBU 3 mL, albuterol (5 MG/ML) 0.5% NEBU 0.5 mL Inhale into the lungs every 4 (four) hours.  Marland Kitchen albuterol (VENTOLIN HFA) 108 (90 Base) MCG/ACT inhaler Inhale 1-2 puffs into the lungs every 6 (six) hours as needed for wheezing or shortness of breath.  Marland Kitchen amLODipine (NORVASC) 10 MG tablet Take 1 tablet (10  mg total) by mouth daily.  Marland Kitchen azelastine (OPTIVAR) 0.05 % ophthalmic solution Place 1 drop into both eyes daily.  . calcium carbonate (OS-CAL - DOSED IN MG OF ELEMENTAL CALCIUM) 1250 (500 Ca) MG tablet Take 1 tablet by mouth daily.  . DULoxetine (CYMBALTA) 20 MG capsule Take 20 mg by mouth every evening.  . ferrous sulfate 325 (65 FE) MG tablet Take 325 mg by mouth daily with breakfast.  . levothyroxine (SYNTHROID) 200 MCG tablet Take 1 tablet (200 mcg total) by mouth daily before breakfast.  .  methocarbamol (ROBAXIN) 500 MG tablet Take 1 tablet (500 mg total) by mouth every 8 (eight) hours as needed.  . naproxen (NAPROSYN) 500 MG tablet Take 1 tablet (500 mg total) by mouth 2 (two) times daily.  Marland Kitchen oxyCODONE (OXYCONTIN) 10 mg 12 hr tablet Take 1 tablet (10 mg total) by mouth every 12 (twelve) hours for 7 days.  . Oxycodone HCl 10 MG TABS Take 1 tablet (10 mg total) by mouth 3 (three) times daily for 7 days.  . potassium chloride SA (KLOR-CON) 20 MEQ tablet Take 1 tablet (20 mEq total) by mouth daily.  . promethazine (PHENERGAN) 25 MG tablet Take 1 tablet (25 mg total) by mouth every 6 (six) hours as needed for nausea or vomiting.  . ramipril (ALTACE) 10 MG capsule Take 1 capsule (10 mg total) by mouth daily.  . Vitamin D, Ergocalciferol, (DRISDOL) 1.25 MG (50000 UNIT) CAPS capsule Take 50,000 Units by mouth every 7 (seven) days.  . [DISCONTINUED] amLODipine (NORVASC) 10 MG tablet Take 10 mg by mouth daily.  . [DISCONTINUED] levothyroxine (SYNTHROID) 200 MCG tablet Take 200 mcg by mouth daily before breakfast.  . [DISCONTINUED] methocarbamol (ROBAXIN) 500 MG tablet Take 1 tablet (500 mg total) by mouth every 8 (eight) hours as needed.  . [DISCONTINUED] naproxen (NAPROSYN) 500 MG tablet Take 1 tablet (500 mg total) by mouth 2 (two) times daily.  . [DISCONTINUED] omeprazole (PRILOSEC) 20 MG capsule Take 20 mg by mouth daily.  . [DISCONTINUED] oxyCODONE (OXYCONTIN) 10 mg 12 hr tablet Take 10 mg by mouth every 12 (twelve) hours.  . [DISCONTINUED] Oxycodone HCl 10 MG TABS Take 10 mg by mouth 3 (three) times daily.  . [DISCONTINUED] ramipril (ALTACE) 10 MG capsule Take 10 mg by mouth daily.  . Dulaglutide (TRULICITY) 0.75 MG/0.5ML SOPN Inject 0.75 mg into the skin once a week.  . Guaifenesin 1200 MG TB12 Take 1 tablet (1,200 mg total) by mouth 2 (two) times daily. (Patient not taking: Reported on 03/10/2019)  . pantoprazole (PROTONIX) 40 MG tablet Take 1 tablet (40 mg total) by mouth daily.  .  Potassium 99 MG TABS Take 1 tablet by mouth daily.   No facility-administered encounter medications on file as of 03/10/2019.    Follow-up: Return in about 6 weeks (around 04/21/2019) for medical record release.   Barbette Merino, NP

## 2019-03-10 NOTE — Telephone Encounter (Signed)
Pt seen today. Wanted to know if they could get a refill on potassium 20mg  and nebulizer.

## 2019-03-10 NOTE — Patient Instructions (Signed)
Hypertension, Adult Hypertension is another name for high blood pressure. High blood pressure forces your heart to work harder to pump blood. This can cause problems over time. There are two numbers in a blood pressure reading. There is a top number (systolic) over a bottom number (diastolic). It is best to have a blood pressure that is below 120/80. Healthy choices can help lower your blood pressure, or you may need medicine to help lower it. What are the causes? The cause of this condition is not known. Some conditions may be related to high blood pressure. What increases the risk?  Smoking.  Having type 2 diabetes mellitus, high cholesterol, or both.  Not getting enough exercise or physical activity.  Being overweight.  Having too much fat, sugar, calories, or salt (sodium) in your diet.  Drinking too much alcohol.  Having long-term (chronic) kidney disease.  Having a family history of high blood pressure.  Age. Risk increases with age.  Race. You may be at higher risk if you are African American.  Gender. Men are at higher risk than women before age 45. After age 65, women are at higher risk than men.  Having obstructive sleep apnea.  Stress. What are the signs or symptoms?  High blood pressure may not cause symptoms. Very high blood pressure (hypertensive crisis) may cause: ? Headache. ? Feelings of worry or nervousness (anxiety). ? Shortness of breath. ? Nosebleed. ? A feeling of being sick to your stomach (nausea). ? Throwing up (vomiting). ? Changes in how you see. ? Very bad chest pain. ? Seizures. How is this treated?  This condition is treated by making healthy lifestyle changes, such as: ? Eating healthy foods. ? Exercising more. ? Drinking less alcohol.  Your health care provider may prescribe medicine if lifestyle changes are not enough to get your blood pressure under control, and if: ? Your top number is above 130. ? Your bottom number is above  80.  Your personal target blood pressure may vary. Follow these instructions at home: Eating and drinking   If told, follow the DASH eating plan. To follow this plan: ? Fill one half of your plate at each meal with fruits and vegetables. ? Fill one fourth of your plate at each meal with whole grains. Whole grains include whole-wheat pasta, brown rice, and whole-grain bread. ? Eat or drink low-fat dairy products, such as skim milk or low-fat yogurt. ? Fill one fourth of your plate at each meal with low-fat (lean) proteins. Low-fat proteins include fish, chicken without skin, eggs, beans, and tofu. ? Avoid fatty meat, cured and processed meat, or chicken with skin. ? Avoid pre-made or processed food.  Eat less than 1,500 mg of salt each day.  Do not drink alcohol if: ? Your doctor tells you not to drink. ? You are pregnant, may be pregnant, or are planning to become pregnant.  If you drink alcohol: ? Limit how much you use to:  0-1 drink a day for women.  0-2 drinks a day for men. ? Be aware of how much alcohol is in your drink. In the [place] one drink equals one 12 oz bottle of beer (355 mL), one 5 oz glass of wine (148 mL), or one 1 oz glass of hard liquor (44 mL). Lifestyle   Work with your doctor to stay at a healthy weight or to lose weight. Ask your doctor what the best weight is for you.  Get at least 30 minutes of exercise most   days of the week. This may include walking, swimming, or biking.  Get at least 30 minutes of exercise that strengthens your muscles (resistance exercise) at least 3 days a week. This may include lifting weights or doing Pilates.  Do not use any products that contain nicotine or tobacco, such as cigarettes, e-cigarettes, and chewing tobacco. If you need help quitting, ask your doctor.  Check your blood pressure at home as told by your doctor.  Keep all follow-up visits as told by your doctor. This is important. Medicines  Take over-the-counter  and prescription medicines only as told by your doctor. Follow directions carefully.  Do not skip doses of blood pressure medicine. The medicine does not work as well if you skip doses. Skipping doses also puts you at risk for problems.  Ask your doctor about side effects or reactions to medicines that you should watch for. Contact a doctor if you:  Think you are having a reaction to the medicine you are taking.  Have headaches that keep coming back (recurring).  Feel dizzy.  Have swelling in your ankles.  Have trouble with your vision. Get help right away if you:  Get a very bad headache.  Start to feel mixed up (confused).  Feel weak or numb.  Feel faint.  Have very bad pain in your: ? Chest. ? Belly (abdomen).  Throw up more than once.  Have trouble breathing. Summary  Hypertension is another name for high blood pressure.  High blood pressure forces your heart to work harder to pump blood.  For most people, a normal blood pressure is less than 120/80.  Making healthy choices can help lower blood pressure. If your blood pressure does not get lower with healthy choices, you may need to take medicine. This information is not intended to replace advice given to you by your health care provider. Make sure you discuss any questions you have with your health care provider. Document Revised: 09/17/2017 Document Reviewed: 09/17/2017 Elsevier Patient Education  2020 [employer]. Obesity, Adult Obesity is having too much body fat. Being obese means that your weight is more than what is healthy for you. BMI is a number that explains how much body fat you have. If you have a BMI of 30 or more, you are obese. Obesity is often caused by eating or drinking more calories than your body uses. Changing your lifestyle can help you lose weight. Obesity can cause serious health problems, such as:  Stroke.  Coronary artery disease (CAD).  Type 2 diabetes.  Some types of cancer,  including cancers of the colon, breast, uterus, and gallbladder.  Osteoarthritis.  High blood pressure (hypertension).  High cholesterol.  Sleep apnea.  Gallbladder stones.  Infertility problems. What are the causes?  Eating meals each day that are high in calories, sugar, and fat.  Being born with genes that may make you more likely to become obese.  Having a medical condition that causes obesity.  Taking certain medicines.  Sitting a lot (having a sedentary lifestyle).  Not getting enough sleep.  Drinking a lot of drinks that have sugar in them. What increases the risk?  Having a family history of obesity.  Being an [place] American woman.  Being a Hispanic man.  Living in an area with limited access to: ? [place] [facility], recreation centers, or sidewalks. ? Healthy food choices, such as grocery stores and farmers' markets. What are the signs or symptoms? The main sign is having too much body fat. How is this  treated?  Treatment for this condition often includes changing your lifestyle. Treatment may include: ? Changing your diet. This may include making a healthy meal plan. ? Exercise. This may include activity that causes your heart to beat faster (aerobic exercise) and strength training. Work with your doctor to design a program that works for you. ? Medicine to help you lose weight. This may be used if you are not able to lose 1 pound a week after 6 weeks of healthy eating and more exercise. ? Treating conditions that cause the obesity. ? Surgery. Options may include gastric banding and gastric bypass. This may be done if:  Other treatments have not helped to improve your condition.  You have a BMI of 40 or higher.  You have life-threatening health problems related to obesity. Follow these instructions at home: Eating and drinking   Follow advice from your doctor about what to eat and drink. Your doctor may tell you to: ? Limit fast food, sweets, and processed  snack foods. ? Choose low-fat options. For example, choose low-fat milk instead of whole milk. ? Eat 5 or more servings of fruits or vegetables each day. ? Eat at home more often. This gives you more control over what you eat. ? Choose healthy foods when you eat out. ? Learn to read food labels. This will help you learn how much food is in 1 serving. ? Keep low-fat snacks available. ? Avoid drinks that have a lot of sugar in them. These include soda, fruit juice, iced tea with sugar, and flavored milk.  Drink enough water to keep your pee (urine) pale yellow.  Do not go on fad diets. Physical activity  Exercise often, as told by your doctor. Most adults should get up to 150 minutes of moderate-intensity exercise every week.Ask your doctor: ? What types of exercise are safe for you. ? How often you should exercise.  Warm up and stretch before being active.  Do slow stretching after being active (cool down).  Rest between times of being active. Lifestyle  Work with your doctor and a food expert (dietitian) to set a weight-loss goal that is best for you.  Limit your screen time.  Find ways to reward yourself that do not involve food.  Do not drink alcohol if: ? Your doctor tells you not to drink. ? You are pregnant, may be pregnant, or are planning to become pregnant.  If you drink alcohol: ? Limit how much you use to:  0-1 drink a day for women.  0-2 drinks a day for men. ? Be aware of how much alcohol is in your drink. In the [place] one drink equals one 12 oz bottle of beer (355 mL), one 5 oz glass of wine (148 mL), or one 1 oz glass of hard liquor (44 mL). General instructions  Keep a weight-loss journal. This can help you keep track of: ? The food that you eat. ? How much exercise you get.  Take over-the-counter and prescription medicines only as told by your doctor.  Take vitamins and supplements only as told by your doctor.  Think about joining a support  group.  Keep all follow-up visits as told by your doctor. This is important. Contact a doctor if:  You cannot meet your weight loss goal after you have changed your diet and lifestyle for 6 weeks. Get help right away if you:  Are having trouble breathing.  Are having thoughts of harming yourself. Summary  Obesity is having too  much body fat.  Being obese means that your weight is more than what is healthy for you.  Work with your doctor to set a weight-loss goal.  Get regular exercise as told by your doctor. This information is not intended to replace advice given to you by your health care provider. Make sure you discuss any questions you have with your health care provider. Document Revised: 09/11/2017 Document Reviewed: 09/11/2017 Elsevier Patient Education  2020 [employer].

## 2019-03-10 NOTE — Telephone Encounter (Signed)
Are these ok to refill for her? Please advise.

## 2019-03-11 LAB — COMP. METABOLIC PANEL (12)
AST: 17 IU/L (ref 0–40)
Albumin/Globulin Ratio: 1.1 — ABNORMAL LOW (ref 1.2–2.2)
Albumin: 3.9 g/dL (ref 3.8–4.8)
Alkaline Phosphatase: 100 IU/L (ref 39–117)
BUN/Creatinine Ratio: 16 (ref 9–23)
BUN: 12 mg/dL (ref 6–24)
Bilirubin Total: 0.2 mg/dL (ref 0.0–1.2)
Calcium: 9.3 mg/dL (ref 8.7–10.2)
Chloride: 101 mmol/L (ref 96–106)
Creatinine, Ser: 0.74 mg/dL (ref 0.57–1.00)
GFR calc Af Amer: 113 mL/min/{1.73_m2} (ref >59)
GFR calc non Af Amer: 98 mL/min/{1.73_m2} (ref >59)
Globulin, Total: 3.4 g/dL (ref 1.5–4.5)
Glucose: 112 mg/dL — ABNORMAL HIGH (ref 65–99)
Potassium: 3.7 mmol/L (ref 3.5–5.2)
Sodium: 143 mmol/L (ref 134–144)
Total Protein: 7.3 g/dL (ref 6.0–8.5)

## 2019-03-11 LAB — CBC WITH DIFFERENTIAL/PLATELET
Basophils Absolute: 0.1 10*3/uL (ref 0.0–0.2)
Basos: 1 %
EOS (ABSOLUTE): 0.2 10*3/uL (ref 0.0–0.4)
Eos: 2 %
Hematocrit: 35.1 % (ref 34.0–46.6)
Hemoglobin: 10.5 g/dL — ABNORMAL LOW (ref 11.1–15.9)
Immature Grans (Abs): 0 10*3/uL (ref 0.0–0.1)
Immature Granulocytes: 0 %
Lymphocytes Absolute: 2.4 10*3/uL (ref 0.7–3.1)
Lymphs: 27 %
MCH: 20 pg — ABNORMAL LOW (ref 26.6–33.0)
MCHC: 29.9 g/dL — ABNORMAL LOW (ref 31.5–35.7)
MCV: 67 fL — ABNORMAL LOW (ref 79–97)
Monocytes Absolute: 0.4 10*3/uL (ref 0.1–0.9)
Monocytes: 5 %
Neutrophils Absolute: 5.9 10*3/uL (ref 1.4–7.0)
Neutrophils: 65 %
Platelets: 312 10*3/uL (ref 150–450)
RBC: 5.24 x10E6/uL (ref 3.77–5.28)
RDW: 22.1 % — ABNORMAL HIGH (ref 11.7–15.4)
WBC: 9 10*3/uL (ref 3.4–10.8)

## 2019-03-15 ENCOUNTER — Other Ambulatory Visit: Payer: Self-pay | Admitting: Nurse Practitioner

## 2019-03-15 ENCOUNTER — Telehealth: Payer: Self-pay | Admitting: Nurse Practitioner

## 2019-03-15 MED ORDER — OXYCODONE HCL ER 10 MG PO T12A: 10.0000 mg | EXTENDED_RELEASE_TABLET | Freq: Two times a day (BID) | ORAL | 0 refills | Status: AC

## 2019-03-15 NOTE — Telephone Encounter (Signed)
error 

## 2019-03-15 NOTE — Telephone Encounter (Signed)
Pt needs refill on 10 mg oxycotin 12 hr tablet. Also pt had a question about why its only a partial refill. In addition, pt hasnt heard back from pain management.

## 2019-03-15 NOTE — Telephone Encounter (Signed)
Called and spoke with [name] with [facility] and they do have the referral and are working on this. New pt appointments are being scheduled 1 month out.

## 2019-03-15 NOTE — Telephone Encounter (Signed)
Pharmacy called, they are out of oxycodone 10 mg. Checked with [facility] on [address] they have enough for pt. Please resend refill.

## 2019-03-15 NOTE — Telephone Encounter (Signed)
Please make her aware that primary care's are not supposed to be prescribing narcotics.  We can only do a 7-day supply.  I am sorry about that but these are the new rules.

## 2019-03-15 NOTE — Telephone Encounter (Signed)
Called and left a message that we are not able to do Pain meds on regular basis and are not able to give her any more meds. Advised that I did speak with Kristin Hurst and she says they have the referral and will reach out to pt or our office with updates. Asked if patient would like to be sent to different pain management that she will call us back. Thanks!

## 2019-03-24 ENCOUNTER — Other Ambulatory Visit: Payer: Self-pay | Admitting: Nurse Practitioner

## 2019-03-24 MED ORDER — GUAIFENESIN-CODEINE 100-10 MG/5ML PO SOLN: 5.0000 mL | Freq: Three times a day (TID) | ORAL | 0 refills | Status: AC | PRN

## 2019-03-31 ENCOUNTER — Ambulatory Visit: Payer: Medicaid Other | Attending: Internal Medicine

## 2019-03-31 DIAGNOSIS — Z20822 Contact with and (suspected) exposure to covid-19: Secondary | ICD-10-CM | POA: Insufficient documentation

## 2019-04-01 LAB — NOVEL CORONAVIRUS, NAA: SARS-CoV-2, NAA: NOT DETECTED

## 2019-04-05 ENCOUNTER — Other Ambulatory Visit: Payer: Self-pay

## 2019-04-07 ENCOUNTER — Other Ambulatory Visit: Payer: Self-pay

## 2019-04-09 ENCOUNTER — Other Ambulatory Visit: Payer: Self-pay

## 2019-04-21 ENCOUNTER — Ambulatory Visit: Payer: Self-pay | Admitting: Nurse Practitioner

## 2019-05-06 ENCOUNTER — Other Ambulatory Visit: Payer: Self-pay

## 2019-05-06 ENCOUNTER — Emergency Department (HOSPITAL_COMMUNITY)
Admission: EM | Admit: 2019-05-06 | Discharge: 2019-05-06 | Disposition: A | Discharge status: Home / Self Care | Payer: Medicaid Other

## 2019-05-06 NOTE — ED Notes (Addendum)
Pt decided to leave because of the 5 hour wait.

## 2019-05-08 ENCOUNTER — Other Ambulatory Visit: Payer: Self-pay

## 2019-05-08 ENCOUNTER — Emergency Department (HOSPITAL_COMMUNITY)
Admission: EM | Admit: 2019-05-08 | Discharge: 2019-05-08 | Disposition: A | Discharge status: Home / Self Care | Payer: Medicaid Other | Attending: Emergency Medicine | Admitting: Emergency Medicine

## 2019-05-08 ENCOUNTER — Encounter (HOSPITAL_COMMUNITY): Payer: Self-pay | Admitting: Emergency Medicine

## 2019-05-08 DIAGNOSIS — J45909 Unspecified asthma, uncomplicated: Secondary | ICD-10-CM | POA: Insufficient documentation

## 2019-05-08 DIAGNOSIS — E119 Type 2 diabetes mellitus without complications: Secondary | ICD-10-CM | POA: Diagnosis not present

## 2019-05-08 DIAGNOSIS — Z79899 Other long term (current) drug therapy: Secondary | ICD-10-CM | POA: Diagnosis not present

## 2019-05-08 DIAGNOSIS — G8929 Other chronic pain: Secondary | ICD-10-CM | POA: Insufficient documentation

## 2019-05-08 DIAGNOSIS — F17210 Nicotine dependence, cigarettes, uncomplicated: Secondary | ICD-10-CM | POA: Insufficient documentation

## 2019-05-08 DIAGNOSIS — I10 Essential (primary) hypertension: Secondary | ICD-10-CM | POA: Diagnosis not present

## 2019-05-08 DIAGNOSIS — Z7984 Long term (current) use of oral hypoglycemic drugs: Secondary | ICD-10-CM | POA: Insufficient documentation

## 2019-05-08 DIAGNOSIS — M791 Myalgia, unspecified site: Secondary | ICD-10-CM | POA: Insufficient documentation

## 2019-05-08 MED ORDER — HYDROCODONE-ACETAMINOPHEN 5-325 MG PO TABS
1.0000 | ORAL_TABLET | Freq: Once | ORAL | Status: AC
  Administered 2019-05-08: 1 via ORAL
  Filled 2019-05-08: qty 1

## 2019-05-08 NOTE — ED Provider Notes (Signed)
[facility] EMERGENCY DEPARTMENT Provider Note   CSN: [id number] Arrival date & time: 05/08/19  1150     History Chief Complaint  Patient presents with  . Back Pain  . Leg Pain    Kristin Hurst is a 44 y.o. female.  HPI HPI Comments: Kristin Hurst is a 44 y.o. female who presents to the Emergency Department complaining of significant chronic pain. Patient states she recently moved from [facility] and has the medical history listed below in addition to rheumatoid arthritis. She has never been evaluated by rheumatologist and just recently received health insurance. She has her first appointment with her new primary care provider on April 28. She has been working for some time to obtain pain management. She presents today with acute on chronic pain. She states she "hurts everywhere". She particularly notes her lumbar region and bilateral knees. She denies any other physical complaints at this time besides pain. Her pain has been uncontrolled with multiple over-the-counter medications. No fevers, chills, abdominal pain, chest pain, shortness of breath, nausea, vomiting, diarrhea, urinary changes, syncope.    Past Medical History:  Diagnosis Date  . Anemia   . Asthma   . Diabetes mellitus without complication (HCC)   . Graves disease   . Hypertension   . Thyroid disease     There are no problems to display for this patient.   Past Surgical History:  Procedure Laterality Date  . THYROIDECTOMY       OB History   No obstetric history on file.     Family History  Problem Relation Age of Onset  . Heart failure Mother   . Diabetes Mother     Social History   Tobacco Use  . Smoking status: Current Every Day Smoker    Packs/day: 0.25    Types: Cigarettes  . Smokeless tobacco: Never Used  Substance Use Topics  . Alcohol use: Yes    Comment: social   . Drug use: Never    Home Medications Prior to Admission medications   Medication Sig Start  Date End Date Taking? Authorizing Provider  acetaminophen (TYLENOL) 500 MG tablet Take 1,000 mg by mouth every 6 (six) hours as needed for moderate pain.    [provider]  acetaminophen-codeine 120-12 MG/5ML solution Take 10 mLs by mouth every 4 (four) hours as needed for moderate pain (and cough). 03/07/19   Lawyer, Cristal Deer, PA-C  albuterol (2.5 MG/3ML) 0.083% NEBU 3 mL, albuterol (5 MG/ML) 0.5% NEBU 0.5 mL Inhale into the lungs every 4 (four) hours.    [provider]  albuterol (VENTOLIN HFA) 108 (90 Base) MCG/ACT inhaler Inhale 1-2 puffs into the lungs every 6 (six) hours as needed for wheezing or shortness of breath.    [provider]  amLODipine (NORVASC) 10 MG tablet Take 1 tablet (10 mg total) by mouth daily. 03/10/19 06/08/19  Barbette Merino, NP  azelastine (OPTIVAR) 0.05 % ophthalmic solution Place 1 drop into both eyes daily.    [provider]  calcium carbonate (OS-CAL - DOSED IN MG OF ELEMENTAL CALCIUM) 1250 (500 Ca) MG tablet Take 1 tablet by mouth daily.    [provider]  Dulaglutide (TRULICITY) 0.75 MG/0.5ML SOPN Inject 0.75 mg into the skin once a week.    [provider]  DULoxetine (CYMBALTA) 20 MG capsule Take 20 mg by mouth every evening.    [provider]  ferrous sulfate 325 (65 FE) MG tablet Take 325 mg by mouth  daily with breakfast.    [provider]  Guaifenesin 1200 MG TB12 Take 1 tablet (1,200 mg total) by mouth 2 (two) times daily. Patient not taking: Reported on 03/10/2019 03/07/19   Dalia Heading, PA-C  levothyroxine (SYNTHROID) 200 MCG tablet Take 1 tablet (200 mcg total) by mouth daily before breakfast. 03/10/19 06/08/19  Vevelyn Francois, NP  naproxen (NAPROSYN) 500 MG tablet Take 1 tablet (500 mg total) by mouth 2 (two) times daily. 03/10/19   Vevelyn Francois, NP  pantoprazole (PROTONIX) 40 MG tablet Take 1 tablet (40 mg total) by mouth daily. 03/10/19 06/08/19  Vevelyn Francois, NP    Potassium 99 MG TABS Take 1 tablet by mouth daily.    [provider]  potassium chloride SA (KLOR-CON) 20 MEQ tablet Take 1 tablet (20 mEq total) by mouth daily. 02/17/19   Petrucelli, Glynda Jaeger, PA-C  promethazine (PHENERGAN) 25 MG tablet Take 1 tablet (25 mg total) by mouth every 6 (six) hours as needed for nausea or vomiting. 03/07/19   Lawyer, Harrell Gave, PA-C  ramipril (ALTACE) 10 MG capsule Take 1 capsule (10 mg total) by mouth daily. 03/10/19 06/08/19  Vevelyn Francois, NP  Vitamin D, Ergocalciferol, (DRISDOL) 1.25 MG (50000 UNIT) CAPS capsule Take 50,000 Units by mouth every 7 (seven) days.    [provider]    Allergies    Shellfish allergy  Review of Systems   Review of Systems  Constitutional: Negative for chills and fever.  HENT: Negative for congestion and drooling.   Respiratory: Negative for shortness of breath.   Cardiovascular: Negative for chest pain and leg swelling.  Gastrointestinal: Negative for abdominal pain, diarrhea, nausea and vomiting.  Genitourinary: Negative for difficulty urinating, dysuria, hematuria and urgency.  Musculoskeletal: Positive for arthralgias, back pain and myalgias.  Skin: Negative for color change and wound.  Neurological: Negative for syncope, weakness and numbness.    Physical Exam Updated Vital Signs BP (!) 141/99 (BP Location: Right Arm)   Temp 98.2 F (36.8 C) (Oral)   Resp 14   Ht 5\' 3"  (1.6 m)   Wt 105.2 kg   SpO2 97%   BMI 41.10 kg/m   Physical Exam Vitals and nursing note reviewed.  Constitutional:      General: She is in acute distress.     Appearance: Normal appearance. She is obese. She is not ill-appearing, toxic-appearing or diaphoretic.  HENT:     Head: Normocephalic and atraumatic.     Nose: Nose normal.     Mouth/Throat:     Mouth: Mucous membranes are moist.     Pharynx: Oropharynx is clear. No oropharyngeal exudate or posterior oropharyngeal erythema.  Eyes:     General: No scleral  icterus.       Right eye: No discharge.        Left eye: No discharge.     Extraocular Movements: Extraocular movements intact.     Conjunctiva/sclera: Conjunctivae normal.     Pupils: Pupils are equal, round, and reactive to light.  Neck:     Comments: No midline C, T, L-spine tenderness. Cardiovascular:     Rate and Rhythm: Normal rate and regular rhythm.     Pulses: Normal pulses.     Heart sounds: Normal heart sounds. No murmur. No friction rub. No gallop.   Pulmonary:     Effort: Pulmonary effort is normal. No respiratory distress.     Breath sounds: Normal breath sounds. No stridor. No wheezing, rhonchi or rales.  Abdominal:     General: Abdomen is flat.     Palpations: Abdomen is soft.     Tenderness: There is no abdominal tenderness.  Musculoskeletal:        General: Tenderness (Diffuse TTP noted in the paraspinal musculature of the lumbar region.) present. No swelling, deformity or signs of injury. Normal range of motion.     Cervical back: Normal range of motion.  Skin:    General: Skin is warm and dry.     Capillary Refill: Capillary refill takes less than 2 seconds.  Neurological:     General: No focal deficit present.     Mental Status: She is alert and oriented to person, place, and time.     Comments: Distal sensation intact. Strength 5 out of 5 in the bilateral upper and lower extremities. 5 out of 5 grip strength noted bilaterally. Patient able to ambulate with a steady gait.  Psychiatric:        Mood and Affect: Mood normal.        Behavior: Behavior normal.    ED Results / Procedures / Treatments   Labs (all labs ordered are listed, but only abnormal results are displayed) Labs Reviewed - No data to display  EKG None  Radiology No results found.  Procedures Procedures   Medications Ordered in ED Medications  HYDROcodone-acetaminophen (NORCO/VICODIN) 5-325 MG per tablet 1 tablet (1 tablet Oral Given 05/08/19 1411)    ED Course  I have reviewed  the triage vital signs and the nursing notes.  Pertinent labs & imaging results that were available during my care of the patient were reviewed by me and considered in my medical decision making (see chart for details).    MDM Rules/Calculators/A&P                      Patient is a 44 year old female who presents with acute on chronic pain. 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 She has her first appointment with her new PCP on the 28th of this month. She presents today with worsening pain that is uncontrolled with over-the-counter pain medications. Her physical exam is reassuring. She has some tenderness in the lumbar region but no midline spine tenderness noted. No neurological abnormalities. No signs and symptoms of cauda equina. She last took NSAIDs this morning without significant relief. She was given one Norco in the ED today for pain management. We discussed continued use of over-the-counter pain medicines and again following up with her primary care provider regarding this visit. She has been seen previously with similar complaints and has been given information on pain management, rheumatology and primary care. Patient understands she can return to the emergency department any new or worsening symptoms. She verbalized understanding of the above plan and is amicable. Vital signs stable.   Patient discharged to home/self care.  Condition at discharge: Stable  Note: Portions of this report may have been transcribed using voice recognition software. Every effort was made to ensure accuracy; however, inadvertent computerized transcription errors may be present.    Final Clinical Impression(s) / ED Diagnoses Final diagnoses:  Other chronic pain    Rx / DC Orders ED Discharge Orders    None       Placido Sou, PA-C 05/08/19 1417    Tilden Fossa, MD 05/09/19 1108

## 2019-05-08 NOTE — ED Triage Notes (Signed)
C/o chronic lower back and bilateral leg pain.  States she was referred to pain management but doesn't have medical insurance to cover it.

## 2019-05-08 NOTE — Discharge Instructions (Addendum)
Please keep your new appointment with your primary care provider on the 28th of this month. They will be able to help you find pain management as well as rheumatology referral. Please not hesitate to return to the emergency department any new or worsening symptoms.

## 2019-05-08 NOTE — ED Notes (Signed)
Patient verbalizes understanding of discharge instructions. Opportunity for questioning and answers were provided. Armband removed by staff, pt discharged from ED and ambulated to lobby to wait for family. 

## 2019-06-17 ENCOUNTER — Emergency Department (HOSPITAL_COMMUNITY)
Admission: EM | Admit: 2019-06-17 | Discharge: 2019-06-17 | Disposition: A | Discharge status: Home / Self Care | Payer: Medicaid Other | Attending: Emergency Medicine | Admitting: Emergency Medicine

## 2019-06-17 ENCOUNTER — Other Ambulatory Visit: Payer: Self-pay

## 2019-06-17 DIAGNOSIS — M791 Myalgia, unspecified site: Secondary | ICD-10-CM | POA: Diagnosis not present

## 2019-06-17 DIAGNOSIS — Z5321 Procedure and treatment not carried out due to patient leaving prior to being seen by health care provider: Secondary | ICD-10-CM | POA: Insufficient documentation

## 2019-06-17 NOTE — ED Triage Notes (Signed)
All charting on this patient by Aloha Gell, RN was charted on the wrong patient.

## 2019-06-18 ENCOUNTER — Encounter (HOSPITAL_COMMUNITY): Payer: Self-pay | Admitting: Emergency Medicine

## 2019-06-18 ENCOUNTER — Other Ambulatory Visit: Payer: Self-pay

## 2019-06-18 ENCOUNTER — Emergency Department (HOSPITAL_COMMUNITY)
Admission: EM | Admit: 2019-06-18 | Discharge: 2019-06-19 | Disposition: A | Discharge status: Home / Self Care | Payer: Medicaid Other | Attending: Emergency Medicine | Admitting: Emergency Medicine

## 2019-06-18 ENCOUNTER — Emergency Department (HOSPITAL_COMMUNITY): Payer: Medicaid Other

## 2019-06-18 DIAGNOSIS — R05 Cough: Secondary | ICD-10-CM | POA: Insufficient documentation

## 2019-06-18 DIAGNOSIS — G8929 Other chronic pain: Secondary | ICD-10-CM | POA: Insufficient documentation

## 2019-06-18 DIAGNOSIS — R059 Cough, unspecified: Secondary | ICD-10-CM

## 2019-06-18 DIAGNOSIS — Z79899 Other long term (current) drug therapy: Secondary | ICD-10-CM | POA: Insufficient documentation

## 2019-06-18 DIAGNOSIS — I10 Essential (primary) hypertension: Secondary | ICD-10-CM | POA: Diagnosis not present

## 2019-06-18 DIAGNOSIS — F17210 Nicotine dependence, cigarettes, uncomplicated: Secondary | ICD-10-CM | POA: Diagnosis not present

## 2019-06-18 DIAGNOSIS — E119 Type 2 diabetes mellitus without complications: Secondary | ICD-10-CM | POA: Insufficient documentation

## 2019-06-18 DIAGNOSIS — Z7984 Long term (current) use of oral hypoglycemic drugs: Secondary | ICD-10-CM | POA: Insufficient documentation

## 2019-06-18 DIAGNOSIS — J45909 Unspecified asthma, uncomplicated: Secondary | ICD-10-CM | POA: Insufficient documentation

## 2019-06-18 DIAGNOSIS — Z20822 Contact with and (suspected) exposure to covid-19: Secondary | ICD-10-CM | POA: Insufficient documentation

## 2019-06-18 DIAGNOSIS — R0789 Other chest pain: Secondary | ICD-10-CM | POA: Diagnosis not present

## 2019-06-18 LAB — URINALYSIS, ROUTINE W REFLEX MICROSCOPIC
Glucose, UA: NEGATIVE mg/dL
Ketones, ur: NEGATIVE mg/dL
Leukocytes,Ua: NEGATIVE
Nitrite: NEGATIVE
Protein, ur: 100 mg/dL — AB
Specific Gravity, Urine: 1.038 — ABNORMAL HIGH (ref 1.005–1.030)
pH: 5 (ref 5.0–8.0)

## 2019-06-18 LAB — POC SARS CORONAVIRUS 2 AG -  ED: SARS Coronavirus 2 Ag: NEGATIVE

## 2019-06-18 LAB — APTT: aPTT: 30 seconds (ref 24–36)

## 2019-06-18 LAB — CBC
HCT: 38.1 % (ref 36.0–46.0)
Hemoglobin: 11.2 g/dL — ABNORMAL LOW (ref 12.0–15.0)
MCH: 21.1 pg — ABNORMAL LOW (ref 26.0–34.0)
MCHC: 29.4 g/dL — ABNORMAL LOW (ref 30.0–36.0)
MCV: 71.6 fL — ABNORMAL LOW (ref 80.0–100.0)
Platelets: 327 10*3/uL (ref 150–400)
RBC: 5.32 MIL/uL — ABNORMAL HIGH (ref 3.87–5.11)
RDW: 18.1 % — ABNORMAL HIGH (ref 11.5–15.5)
WBC: 8.5 10*3/uL (ref 4.0–10.5)
nRBC: 0 % (ref 0.0–0.2)

## 2019-06-18 LAB — LACTIC ACID, PLASMA: Lactic Acid, Venous: 2.5 mmol/L (ref 0.5–1.9)

## 2019-06-18 LAB — BASIC METABOLIC PANEL
Anion gap: 14 (ref 5–15)
BUN: 6 mg/dL (ref 6–20)
CO2: 24 mmol/L (ref 22–32)
Calcium: 9.3 mg/dL (ref 8.9–10.3)
Chloride: 100 mmol/L (ref 98–111)
Creatinine, Ser: 0.66 mg/dL (ref 0.44–1.00)
GFR calc Af Amer: >60 mL/min (ref >60)
GFR calc non Af Amer: >60 mL/min (ref >60)
Glucose, Bld: 117 mg/dL — ABNORMAL HIGH (ref 70–99)
Potassium: 3 mmol/L — ABNORMAL LOW (ref 3.5–5.1)
Sodium: 138 mmol/L (ref 135–145)

## 2019-06-18 LAB — I-STAT BETA HCG BLOOD, ED (MC, WL, AP ONLY): I-stat hCG, quantitative: <5 m[IU]/mL (ref <5)

## 2019-06-18 LAB — SARS CORONAVIRUS 2 BY RT PCR (HOSPITAL ORDER, PERFORMED IN ~~LOC~~ HOSPITAL LAB): SARS Coronavirus 2: NEGATIVE

## 2019-06-18 LAB — TROPONIN I (HIGH SENSITIVITY)
Troponin I (High Sensitivity): 4 ng/L (ref <18)
Troponin I (High Sensitivity): 6 ng/L (ref <18)

## 2019-06-18 LAB — PROTIME-INR
INR: 1.1 (ref 0.8–1.2)
Prothrombin Time: 13.6 seconds (ref 11.4–15.2)

## 2019-06-18 MED ORDER — HYDROCODONE-ACETAMINOPHEN 5-325 MG PO TABS
1.0000 | ORAL_TABLET | Freq: Once | ORAL | Status: AC
  Administered 2019-06-18: 1 via ORAL
  Filled 2019-06-18: qty 1

## 2019-06-18 MED ORDER — SODIUM CHLORIDE 0.9 % IV SOLN
500.0000 mg | INTRAVENOUS | Status: DC
  Administered 2019-06-18: 500 mg via INTRAVENOUS
  Filled 2019-06-18: qty 500

## 2019-06-18 MED ORDER — SODIUM CHLORIDE 0.9 % IV SOLN
2.0000 g | INTRAVENOUS | Status: DC
  Administered 2019-06-18: 2 g via INTRAVENOUS
  Filled 2019-06-18: qty 20

## 2019-06-18 MED ORDER — SODIUM CHLORIDE 0.9% FLUSH
3.0000 mL | Freq: Once | INTRAVENOUS | Status: AC
  Administered 2019-06-18: 3 mL via INTRAVENOUS

## 2019-06-18 MED ORDER — ACETAMINOPHEN 325 MG PO TABS
650.0000 mg | ORAL_TABLET | Freq: Once | ORAL | Status: AC
  Administered 2019-06-18: 650 mg via ORAL
  Filled 2019-06-18: qty 2

## 2019-06-18 MED ORDER — SODIUM CHLORIDE 0.9 % IV BOLUS
1000.0000 mL | Freq: Once | INTRAVENOUS | Status: AC
  Administered 2019-06-18: 1000 mL via INTRAVENOUS

## 2019-06-18 MED ORDER — POTASSIUM CHLORIDE CRYS ER 20 MEQ PO TBCR
40.0000 meq | EXTENDED_RELEASE_TABLET | Freq: Once | ORAL | Status: AC
  Administered 2019-06-19: 40 meq via ORAL
  Filled 2019-06-18: qty 2

## 2019-06-18 NOTE — ED Notes (Signed)
Pt states condition is worsening, reassessed vitals notified pt as soon as triage RN is available the staff will get pt back to be reassessed. Pt acknowledges. Notified Ashley(NT)

## 2019-06-18 NOTE — ED Triage Notes (Signed)
Pt c/o chest pain, cough, congestion, as well as pain in her back and BLE. Pt states she is out of her pain medication that she takes for chronic pain.

## 2019-06-18 NOTE — ED Provider Notes (Signed)
[facility] EMERGENCY DEPARTMENT Provider Note   CSN: [id number] Arrival date & time: 06/18/19  1700     History Chief Complaint  Patient presents with  . Chest Pain    Skila Rollins is a 44 y.o. female with a past medical history significant for anemia, asthma, diabetes, Graves' disease, rheumatoid arthritis, and hypertension who presents to the ED due to acute on chronic pain.  Patient notes she recently ran out of her oxycodone and her PCP would not refill her pain medication.  Patient has a history of RA and had a flare in April where she was given a steroid injection.  Patient has been taking Xtampza, oxycodone, and tizanidine which she just ran out of.  Patient admits to "her whole body hurting".  She describes the pain as "tightness throughout her body".  Patient has a pain management appointment scheduled for 6/2 at [facility].  She also admits to a dry cough that has progressively worsened over the past 2 days associated with nasal congestion, fever, and body aches.  She has tried over-the-counter Robitussin with no relief.  Denies sick contacts and known Covid exposures.  She admits to having a Covid test 2 weeks ago which was negative.  She also admits to chest pain only while coughing.  Denies lower extremity edema, history of blood clots, recent surgeries, recent long immobilizations, and hormonal treatments.  Denies urinary and vaginal symptoms.  History obtained from patient and past medical records. No interpreter used during encounter.      Past Medical History:  Diagnosis Date  . Anemia   . Asthma   . Diabetes mellitus without complication (HCC)   . Graves disease   . Hypertension   . Thyroid disease     There are no problems to display for this patient.   Past Surgical History:  Procedure Laterality Date  . THYROIDECTOMY       OB History   No obstetric history on file.     Family History  Problem Relation Age of Onset  .  Heart failure Mother   . Diabetes Mother     Social History   Tobacco Use  . Smoking status: Current Every Day Smoker    Packs/day: 0.25    Types: Cigarettes  . Smokeless tobacco: Never Used  Substance Use Topics  . Alcohol use: Yes    Comment: social   . Drug use: Never    Home Medications Prior to Admission medications   Medication Sig Start Date End Date Taking? Authorizing Provider  acetaminophen (TYLENOL) 500 MG tablet Take 1,000 mg by mouth every 6 (six) hours as needed for moderate pain.    [provider]  acetaminophen-codeine 120-12 MG/5ML solution Take 10 mLs by mouth every 4 (four) hours as needed for moderate pain (and cough). 03/07/19   Lawyer, Cristal Deer, PA-C  albuterol (2.5 MG/3ML) 0.083% NEBU 3 mL, albuterol (5 MG/ML) 0.5% NEBU 0.5 mL Inhale into the lungs every 4 (four) hours.    [provider]  albuterol (VENTOLIN HFA) 108 (90 Base) MCG/ACT inhaler Inhale 1-2 puffs into the lungs every 6 (six) hours as needed for wheezing or shortness of breath.    [provider]  amLODipine (NORVASC) 10 MG tablet Take 1 tablet (10 mg total) by mouth daily. 03/10/19 06/08/19  Barbette Merino, NP  azelastine (OPTIVAR) 0.05 % ophthalmic solution Place 1 drop into both eyes daily.    [provider]  benzonatate (TESSALON) 100 MG capsule  Take 1 capsule (100 mg total) by mouth every 8 (eight) hours. 06/19/19   Mannie Stabile, PA-C  calcium carbonate (OS-CAL - DOSED IN MG OF ELEMENTAL CALCIUM) 1250 (500 Ca) MG tablet Take 1 tablet by mouth daily.    [provider]  Dulaglutide (TRULICITY) 0.75 MG/0.5ML SOPN Inject 0.75 mg into the skin once a week.    [provider]  DULoxetine (CYMBALTA) 20 MG capsule Take 20 mg by mouth every evening.    [provider]  ferrous sulfate 325 (65 FE) MG tablet Take 325 mg by mouth daily with breakfast.    [provider]  Guaifenesin 1200 MG TB12 Take 1 tablet (1,200 mg total)  by mouth 2 (two) times daily. Patient not taking: Reported on 03/10/2019 03/07/19   Charlestine Night, PA-C  levothyroxine (SYNTHROID) 200 MCG tablet Take 1 tablet (200 mcg total) by mouth daily before breakfast. 03/10/19 06/08/19  Barbette Merino, NP  methocarbamol (ROBAXIN) 500 MG tablet Take 1 tablet (500 mg total) by mouth 2 (two) times daily. 06/19/19   Mannie Stabile, PA-C  naproxen (NAPROSYN) 500 MG tablet Take 1 tablet (500 mg total) by mouth 2 (two) times daily. 03/10/19   Barbette Merino, NP  pantoprazole (PROTONIX) 40 MG tablet Take 1 tablet (40 mg total) by mouth daily. 03/10/19 06/08/19  Barbette Merino, NP  Potassium 99 MG TABS Take 1 tablet by mouth daily.    [provider]  potassium chloride SA (KLOR-CON) 20 MEQ tablet Take 1 tablet (20 mEq total) by mouth daily. 02/17/19   Petrucelli, Pleas Koch, PA-C  promethazine (PHENERGAN) 25 MG tablet Take 1 tablet (25 mg total) by mouth every 6 (six) hours as needed for nausea or vomiting. 03/07/19   Lawyer, Cristal Deer, PA-C  ramipril (ALTACE) 10 MG capsule Take 1 capsule (10 mg total) by mouth daily. 03/10/19 06/08/19  Barbette Merino, NP  Vitamin D, Ergocalciferol, (DRISDOL) 1.25 MG (50000 UNIT) CAPS capsule Take 50,000 Units by mouth every 7 (seven) days.    [provider]    Allergies    Shellfish allergy  Review of Systems   Review of Systems  Constitutional: Positive for chills and fever.  Respiratory: Positive for cough. Negative for shortness of breath.   Cardiovascular: Positive for chest pain (only while coughing).  Gastrointestinal: Negative for abdominal pain, diarrhea, nausea and vomiting.  Genitourinary: Negative for dysuria and vaginal discharge.  Musculoskeletal: Positive for arthralgias and back pain.  Neurological: Negative for headaches.  All other systems reviewed and are negative.   Physical Exam Updated Vital Signs BP 128/81   Pulse (!) 118   Temp 100 F (37.8 C) (Oral)   Resp 20   Ht 5'  3" (1.6 m)   Wt 104.3 kg   SpO2 96%   BMI 40.74 kg/m   Physical Exam Vitals and nursing note reviewed.  Constitutional:      General: She is not in acute distress.    Appearance: She is not toxic-appearing.  HENT:     Head: Normocephalic.  Eyes:     Pupils: Pupils are equal, round, and reactive to light.  Neck:     Comments: No meningismus. Cardiovascular:     Rate and Rhythm: Regular rhythm. Tachycardia present.     Pulses: Normal pulses.     Heart sounds: Normal heart sounds. No murmur. No friction rub. No gallop.   Pulmonary:     Effort: Pulmonary effort is normal.  Breath sounds: Normal breath sounds.     Comments: Respirations equal and unlabored, patient able to speak in full sentences, lungs clear to auscultation bilaterally Abdominal:     General: Abdomen is flat. Bowel sounds are normal. There is no distension.     Palpations: Abdomen is soft.     Tenderness: There is abdominal tenderness. There is no right CVA tenderness, left CVA tenderness, guarding or rebound.     Comments: Diffuse abdominal tenderness.  No focal tenderness.  No guarding or rebound.  Musculoskeletal:     Cervical back: Neck supple.     Comments: Diffuse tenderness throughout the musculature of the back.  No thoracic or lumbar midline tenderness.  Skin:    General: Skin is warm and dry.  Neurological:     General: No focal deficit present.     Mental Status: She is alert.  Psychiatric:        Mood and Affect: Mood normal.        Behavior: Behavior normal.     ED Results / Procedures / Treatments   Labs (all labs ordered are listed, but only abnormal results are displayed) Labs Reviewed  BASIC METABOLIC PANEL - Abnormal; Notable for the following components:      Result Value   Potassium 3.0 (*)    Glucose, Bld 117 (*)    All other components within normal limits  CBC - Abnormal; Notable for the following components:   RBC 5.32 (*)    Hemoglobin 11.2 (*)    MCV 71.6 (*)    MCH  21.1 (*)    MCHC 29.4 (*)    RDW 18.1 (*)    All other components within normal limits  LACTIC ACID, PLASMA - Abnormal; Notable for the following components:   Lactic Acid, Venous 2.5 (*)    All other components within normal limits  URINALYSIS, ROUTINE W REFLEX MICROSCOPIC - Abnormal; Notable for the following components:   Color, Urine AMBER (*)    APPearance HAZY (*)    Specific Gravity, Urine 1.038 (*)    Hgb urine dipstick SMALL (*)    Bilirubin Urine SMALL (*)    Protein, ur 100 (*)    Bacteria, UA RARE (*)    All other components within normal limits  SARS CORONAVIRUS 2 BY RT PCR (HOSPITAL ORDER, PERFORMED IN [facility])  CULTURE, BLOOD (ROUTINE X 2)  CULTURE, BLOOD (ROUTINE X 2)  URINE CULTURE  APTT  PROTIME-INR  I-STAT BETA HCG BLOOD, ED ([facility], [facility], [facility] ONLY)  POC SARS CORONAVIRUS 2 AG -  ED  TROPONIN I (HIGH SENSITIVITY)  TROPONIN I (HIGH SENSITIVITY)    EKG None  Radiology DG Chest 2 View  Result Date: 06/18/2019 CLINICAL DATA:  44 year old female with chest pain. EXAM: CHEST - 2 VIEW COMPARISON:  Chest radiograph dated 03/07/2019. FINDINGS: No focal consolidation, pleural effusion, pneumothorax. The cardiac silhouette is within normal limits. Thyroidectomy surgical clips. No acute osseous pathology. IMPRESSION: No active cardiopulmonary disease. Electronically Signed   By: Elgie Collard M.D.   On: 06/18/2019 18:09    Procedures Procedures (including critical care time)  Medications Ordered in ED Medications  cefTRIAXone (ROCEPHIN) 2 g in sodium chloride 0.9 % 100 mL IVPB (0 g Intravenous Stopped 06/18/19 2114)  azithromycin (ZITHROMAX) 500 mg in sodium chloride 0.9 % 250 mL IVPB (0 mg Intravenous Stopped 06/18/19 2228)  potassium chloride SA (KLOR-CON) CR tablet 40 mEq (has no administration in time range)  sodium chloride flush (  NS) 0.9 % injection 3 mL (3 mLs Intravenous Given 06/18/19 2124)  sodium chloride 0.9 % bolus 1,000 mL (1,000 mLs  Intravenous New Bag/Given 06/18/19 2039)  acetaminophen (TYLENOL) tablet 650 mg (650 mg Oral Given 06/18/19 2041)  HYDROcodone-acetaminophen (NORCO/VICODIN) 5-325 MG per tablet 1 tablet (1 tablet Oral Given 06/18/19 2207)    ED Course  I have reviewed the triage vital signs and the nursing notes.  Pertinent labs & imaging results that were available during my care of the patient were reviewed by me and considered in my medical decision making (see chart for details).  Clinical Course as of Jun 19 23  Fri Jun 18, 2019  2018 Code sepsis initiated after initial evaluation. Patient febrile at 101.4 on my exam and tachycardic.   [CA]  2234 Lactic Acid, Venous(!!): 2.5 [CA]  2235 Potassium(!): 3.0 [CA]  Sat Jun 19, 2019  0009 SARS Coronavirus 2: NEGATIVE [CA]  0011 Spoke to [facility] who notes that patient's symptoms do not meet criteria for admission given negative CXR and negative COVID test.    [CA]    Clinical Course User Index [CA] Suzy Bouchard, PA-C   MDM Rules/Calculators/A&P                     44 year old female presents to the ED due to acute on chronic pain.  She also admits to having a dry cough and nasal congestion for the past few days.  Patient denies sick contacts and Covid exposures.  Upon arrival, patient tachycardic.  Temperature checked at bedside which was 101.5 F. Code sepsis initiated after initial evaluation. IV abx and fluids started for possible respiratory source given patient's cough and fever. COVID test ordered. Sepsis labs ordered.  No meningismus to suggest meningitis. Discussed case with Dr. Tamera Punt who agrees with assessment and plan.   CBC reassuring with no leukocytosis.  BMP significant for hypokalemia at 3, but otherwise reassuring. Potassium repleted here in the ED. UA negative for signs of infection.  Delta troponin flat.  Doubt ACS.  lactic acid elevated at 2.5.  Chest x-ray personally reviewed which is negative for signs of pneumonia, pneumothorax, or  widened mediastinum. Rapid COVID negative. Will obtain PCR COVID test. EKG personally reviewed which demonstrates sinus tachycardia with no signs of acute ischemia.  Discussed case with [facility] about possible admission. See note above. Patient notes improvement in symptoms and feels comfortable going home. Suspect symptoms related to bronchitis vs. Viral URI vs. Possible RA flare with low grade fever. Will discharge patient with cough medication and muscle relaxer. Will hold steroids given history of diabetes. Advised patient to report to her pain management appointment next week. Instructed patient to follow-up with PCP early next week if symptoms do not improve. Strict ED precautions discussed with patient. Patient states understanding and agrees to plan. Patient discharged home in no acute distress and stable vitals  Final Clinical Impression(s) / ED Diagnoses Final diagnoses:  Cough  Other chronic pain    Rx / DC Orders ED Discharge Orders         Ordered    benzonatate (TESSALON) 100 MG capsule  Every 8 hours     06/19/19 0018    methocarbamol (ROBAXIN) 500 MG tablet  2 times daily     06/19/19 0019           Suzy Bouchard, PA-C 06/19/19 0028    Malvin Johns, MD 06/19/19 [phone]

## 2019-06-19 MED ORDER — BENZONATATE 100 MG PO CAPS
100.0000 mg | ORAL_CAPSULE | Freq: Three times a day (TID) | ORAL | 0 refills | Status: DC
Start: 2019-06-19 — End: 2019-07-15

## 2019-06-19 MED ORDER — METHOCARBAMOL 500 MG PO TABS: 500.0000 mg | ORAL_TABLET | Freq: Two times a day (BID) | ORAL | 0 refills | Status: AC

## 2019-06-19 NOTE — Discharge Instructions (Signed)
As discussed, your labs are reassuring.  Your chest x-ray was negative for signs of pneumonia.  Your Covid test is negative.  I suspect your cough is related to a viral infection.  Continue to take your medication as prescribed.  I am sending home with a muscle relaxer.  Take as prescribed.  Medication can cause drowsiness so do not drive or operate machinery while on the medication.  You may take over-the-counter ibuprofen or Tylenol as needed for pain.  Return to the ER for new or worsening symptoms.

## 2019-06-20 LAB — URINE CULTURE: Culture: <10000 — AB

## 2019-06-23 LAB — CULTURE, BLOOD (ROUTINE X 2)
Culture: NO GROWTH
Culture: NO GROWTH
Special Requests: ADEQUATE
Special Requests: ADEQUATE

## 2019-06-28 ENCOUNTER — Other Ambulatory Visit: Payer: Self-pay

## 2019-06-28 ENCOUNTER — Emergency Department (HOSPITAL_COMMUNITY)
Admission: EM | Admit: 2019-06-28 | Discharge: 2019-06-28 | Disposition: A | Discharge status: Home / Self Care | Payer: Medicaid Other | Attending: Emergency Medicine | Admitting: Emergency Medicine

## 2019-06-28 ENCOUNTER — Encounter (HOSPITAL_COMMUNITY): Payer: Self-pay

## 2019-06-28 ENCOUNTER — Emergency Department (HOSPITAL_COMMUNITY): Payer: Medicaid Other

## 2019-06-28 DIAGNOSIS — R509 Fever, unspecified: Secondary | ICD-10-CM | POA: Insufficient documentation

## 2019-06-28 DIAGNOSIS — M7918 Myalgia, other site: Secondary | ICD-10-CM | POA: Insufficient documentation

## 2019-06-28 DIAGNOSIS — Z5321 Procedure and treatment not carried out due to patient leaving prior to being seen by health care provider: Secondary | ICD-10-CM | POA: Insufficient documentation

## 2019-06-28 DIAGNOSIS — R112 Nausea with vomiting, unspecified: Secondary | ICD-10-CM | POA: Insufficient documentation

## 2019-06-28 LAB — COMPREHENSIVE METABOLIC PANEL
ALT: 18 U/L (ref 0–44)
AST: 15 U/L (ref 15–41)
Albumin: 3.6 g/dL (ref 3.5–5.0)
Alkaline Phosphatase: 99 U/L (ref 38–126)
Anion gap: 10 (ref 5–15)
BUN: 9 mg/dL (ref 6–20)
CO2: 28 mmol/L (ref 22–32)
Calcium: 9 mg/dL (ref 8.9–10.3)
Chloride: 102 mmol/L (ref 98–111)
Creatinine, Ser: 0.72 mg/dL (ref 0.44–1.00)
GFR calc Af Amer: >60 mL/min (ref >60)
GFR calc non Af Amer: >60 mL/min (ref >60)
Glucose, Bld: 141 mg/dL — ABNORMAL HIGH (ref 70–99)
Potassium: 3.1 mmol/L — ABNORMAL LOW (ref 3.5–5.1)
Sodium: 140 mmol/L (ref 135–145)
Total Bilirubin: 0.4 mg/dL (ref 0.3–1.2)
Total Protein: 7.3 g/dL (ref 6.5–8.1)

## 2019-06-28 LAB — CBC WITH DIFFERENTIAL/PLATELET
Abs Immature Granulocytes: 0.06 10*3/uL (ref 0.00–0.07)
Basophils Absolute: 0.1 10*3/uL (ref 0.0–0.1)
Basophils Relative: 1 %
Eosinophils Absolute: 0.2 10*3/uL (ref 0.0–0.5)
Eosinophils Relative: 2 %
HCT: 35.5 % — ABNORMAL LOW (ref 36.0–46.0)
Hemoglobin: 10.3 g/dL — ABNORMAL LOW (ref 12.0–15.0)
Immature Granulocytes: 1 %
Lymphocytes Relative: 32 %
Lymphs Abs: 3.4 10*3/uL (ref 0.7–4.0)
MCH: 20.8 pg — ABNORMAL LOW (ref 26.0–34.0)
MCHC: 29 g/dL — ABNORMAL LOW (ref 30.0–36.0)
MCV: 71.7 fL — ABNORMAL LOW (ref 80.0–100.0)
Monocytes Absolute: 0.6 10*3/uL (ref 0.1–1.0)
Monocytes Relative: 6 %
Neutro Abs: 6.3 10*3/uL (ref 1.7–7.7)
Neutrophils Relative %: 58 %
Platelets: 296 10*3/uL (ref 150–400)
RBC: 4.95 MIL/uL (ref 3.87–5.11)
RDW: 18.1 % — ABNORMAL HIGH (ref 11.5–15.5)
WBC: 10.5 10*3/uL (ref 4.0–10.5)
nRBC: 0 % (ref 0.0–0.2)

## 2019-06-28 LAB — LACTIC ACID, PLASMA: Lactic Acid, Venous: 2.1 mmol/L (ref 0.5–1.9)

## 2019-06-28 LAB — I-STAT BETA HCG BLOOD, ED (MC, WL, AP ONLY): I-stat hCG, quantitative: <5 m[IU]/mL (ref <5)

## 2019-06-28 NOTE — ED Triage Notes (Signed)
Pt arrives POV for eval of myalgias, nausea, vomiting and fever. Pt reports she was seen here last week for same, offered admission but stated she wished to go home. Pt reports she has continued to feel worse.

## 2019-06-28 NOTE — ED Notes (Signed)
Called for recheck vitals, no answer °

## 2019-06-28 NOTE — ED Notes (Signed)
Pt called 3 times, no answer 

## 2019-07-03 ENCOUNTER — Other Ambulatory Visit: Payer: Self-pay

## 2019-07-03 ENCOUNTER — Emergency Department (HOSPITAL_COMMUNITY)
Admission: EM | Admit: 2019-07-03 | Discharge: 2019-07-03 | Disposition: A | Discharge status: Home / Self Care | Payer: Medicaid Other | Attending: Emergency Medicine | Admitting: Emergency Medicine

## 2019-07-03 ENCOUNTER — Encounter (HOSPITAL_COMMUNITY): Payer: Self-pay

## 2019-07-03 ENCOUNTER — Emergency Department (HOSPITAL_COMMUNITY): Payer: Medicaid Other

## 2019-07-03 DIAGNOSIS — M549 Dorsalgia, unspecified: Secondary | ICD-10-CM | POA: Diagnosis not present

## 2019-07-03 DIAGNOSIS — R0789 Other chest pain: Secondary | ICD-10-CM | POA: Diagnosis present

## 2019-07-03 DIAGNOSIS — Z5321 Procedure and treatment not carried out due to patient leaving prior to being seen by health care provider: Secondary | ICD-10-CM | POA: Insufficient documentation

## 2019-07-03 DIAGNOSIS — M79601 Pain in right arm: Secondary | ICD-10-CM | POA: Diagnosis not present

## 2019-07-03 LAB — BASIC METABOLIC PANEL
Anion gap: 15 (ref 5–15)
BUN: 14 mg/dL (ref 6–20)
CO2: 24 mmol/L (ref 22–32)
Calcium: 8.2 mg/dL — ABNORMAL LOW (ref 8.9–10.3)
Chloride: 103 mmol/L (ref 98–111)
Creatinine, Ser: 0.73 mg/dL (ref 0.44–1.00)
GFR calc Af Amer: >60 mL/min (ref >60)
GFR calc non Af Amer: >60 mL/min (ref >60)
Glucose, Bld: 144 mg/dL — ABNORMAL HIGH (ref 70–99)
Potassium: 3 mmol/L — ABNORMAL LOW (ref 3.5–5.1)
Sodium: 142 mmol/L (ref 135–145)

## 2019-07-03 LAB — I-STAT BETA HCG BLOOD, ED (NOT ORDERABLE): I-stat hCG, quantitative: <5 m[IU]/mL (ref <5)

## 2019-07-03 LAB — CBC
HCT: 32.6 % — ABNORMAL LOW (ref 36.0–46.0)
Hemoglobin: 9.7 g/dL — ABNORMAL LOW (ref 12.0–15.0)
MCH: 21.2 pg — ABNORMAL LOW (ref 26.0–34.0)
MCHC: 29.8 g/dL — ABNORMAL LOW (ref 30.0–36.0)
MCV: 71.3 fL — ABNORMAL LOW (ref 80.0–100.0)
Platelets: 311 10*3/uL (ref 150–400)
RBC: 4.57 MIL/uL (ref 3.87–5.11)
RDW: 18.6 % — ABNORMAL HIGH (ref 11.5–15.5)
WBC: 12.3 10*3/uL — ABNORMAL HIGH (ref 4.0–10.5)
nRBC: 0 % (ref 0.0–0.2)

## 2019-07-03 LAB — TROPONIN I (HIGH SENSITIVITY): Troponin I (High Sensitivity): 3 ng/L (ref <18)

## 2019-07-03 MED ORDER — SODIUM CHLORIDE 0.9% FLUSH: 3.0000 mL | Freq: Once | INTRAVENOUS | Status: DC

## 2019-07-03 NOTE — ED Notes (Signed)
Pt upset about the wait. Pt asking for someone to come give her results. Explained to pt that she will need to see the EDP for  Results but is unhappy with the explanation that I gave her. Pt demanding to speak to Consulting civil engineer. RN aware.

## 2019-07-03 NOTE — ED Triage Notes (Signed)
Arrived POv from home. Patient reports chest pain, back pain, and right arm pain for past few days. Patient states she is supposed to starte going to a pain clinic. Also reports SHOB with exertion. NAD

## 2019-07-03 NOTE — ED Notes (Signed)
Pt seen leaving the ED

## 2019-07-12 ENCOUNTER — Ambulatory Visit: Payer: Medicaid Other | Admitting: Nurse Practitioner

## 2019-07-15 ENCOUNTER — Ambulatory Visit (INDEPENDENT_AMBULATORY_CARE_PROVIDER_SITE_OTHER): Payer: Medicaid Other | Admitting: Nurse Practitioner

## 2019-07-15 ENCOUNTER — Other Ambulatory Visit: Payer: Self-pay

## 2019-07-15 ENCOUNTER — Encounter: Payer: Self-pay | Admitting: Nurse Practitioner

## 2019-07-15 VITALS — BP 121/81 | HR 72 | Temp 98.8°F | Ht 62.0 in | Wt 235.2 lb

## 2019-07-15 DIAGNOSIS — R29818 Other symptoms and signs involving the nervous system: Secondary | ICD-10-CM

## 2019-07-15 DIAGNOSIS — E661 Drug-induced obesity: Secondary | ICD-10-CM

## 2019-07-15 DIAGNOSIS — R319 Hematuria, unspecified: Secondary | ICD-10-CM | POA: Diagnosis not present

## 2019-07-15 DIAGNOSIS — J45909 Unspecified asthma, uncomplicated: Secondary | ICD-10-CM

## 2019-07-15 DIAGNOSIS — D508 Other iron deficiency anemias: Secondary | ICD-10-CM

## 2019-07-15 DIAGNOSIS — I10 Essential (primary) hypertension: Secondary | ICD-10-CM | POA: Diagnosis not present

## 2019-07-15 DIAGNOSIS — R7303 Prediabetes: Secondary | ICD-10-CM

## 2019-07-15 DIAGNOSIS — G894 Chronic pain syndrome: Secondary | ICD-10-CM | POA: Diagnosis not present

## 2019-07-15 DIAGNOSIS — E0500 Thyrotoxicosis with diffuse goiter without thyrotoxic crisis or storm: Secondary | ICD-10-CM

## 2019-07-15 LAB — POCT GLYCOSYLATED HEMOGLOBIN (HGB A1C)
HbA1c POC (<> result, manual entry): 6.2 % (ref 4.0–5.6)
HbA1c, POC (controlled diabetic range): 6.2 % (ref 0.0–7.0)
HbA1c, POC (prediabetic range): 6.2 % (ref 5.7–6.4)
Hemoglobin A1C: 6.2 % — AB (ref 4.0–5.6)

## 2019-07-15 LAB — POCT URINALYSIS DIPSTICK
Bilirubin, UA: NEGATIVE
Glucose, UA: NEGATIVE
Ketones, UA: NEGATIVE
Leukocytes, UA: NEGATIVE
Nitrite, UA: NEGATIVE
Protein, UA: NEGATIVE
Spec Grav, UA: 1.025 (ref 1.010–1.025)
Urobilinogen, UA: 0.2 E.U./dL
pH, UA: 7 (ref 5.0–8.0)

## 2019-07-15 MED ORDER — XTAMPZA ER 9 MG PO C12A: 1.0000 | EXTENDED_RELEASE_CAPSULE | Freq: Two times a day (BID) | ORAL | 0 refills | Status: DC

## 2019-07-15 MED ORDER — OXYCODONE HCL 10 MG PO TABS: 10.0000 mg | ORAL_TABLET | Freq: Two times a day (BID) | ORAL | 0 refills | Status: AC

## 2019-07-15 NOTE — Progress Notes (Signed)
[facility] Patient [facility] [address] [place], [place]  [address] Phone:  [phone]   Fax:  [phone]   Established Patient Office Visit  Subjective:  Patient ID: Kristin Hurst, female    DOB: Jun 05, 1975  Age: 44 y.o. MRN: [id number]  CC:  Chief Complaint  Patient presents with  . Follow-up    re-est care,, discuss medication , discuss referral several     HPI Kristin Hurst presents for reestablish care. She  has a past medical history of Anemia, Asthma, Diabetes mellitus without complication (HCC), Graves disease, Hypertension, and Thyroid disease.  Admits that she was seen at [facility] medical where she had established care for both primary and pain management.  She feels that she made a mistake and would like to reestablish care here.  She admits that she was just discharged from the pain management because her medication did not show up in her system.  She states that she takes her medication as directed and is not sure what the problem was.  She has been out of work this week due to pain and upper respiratory symptoms.  She has a nonproductive cough.  She admits that it is not related to COVID-19 because she has had several tests which have been negative.  She denies headache, dizziness, visual changes, shortness of breath, dyspnea on exertion, chest pain, nausea, vomiting or any edema.   she has multiple issues which were addressed at her previous appointment.  She feels like her muscle spasms have increased.  She is requesting referral to pain management, endocrinology and weight management. She admits that she just did not feel well overall.  She does and has told that she stops breathing while she is sleeping.  She denies a previous sleep study.   Past Medical History:  Diagnosis Date  . Anemia   . Asthma   . Diabetes mellitus without complication (HCC)   . Graves disease   . Hypertension   . Thyroid disease     Past Surgical History:  Procedure Laterality  Date  . THYROIDECTOMY      Family History  Problem Relation Age of Onset  . Heart failure Mother   . Diabetes Mother     Social History   Socioeconomic History  . Marital status: Single    Spouse name: Not on file  . Number of children: Not on file  . Years of education: Not on file  . Highest education level: Not on file  Occupational History  . Not on file  Tobacco Use  . Smoking status: Current Every Day Smoker    Packs/day: 0.25    Types: Cigarettes  . Smokeless tobacco: Never Used  Vaping Use  . Vaping Use: Never used  Substance and Sexual Activity  . Alcohol use: Yes    Comment: social   . Drug use: Never  . Sexual activity: Yes  Other Topics Concern  . Not on file  Social History Narrative  . Not on file   Social Determinants of Health   Financial Resource Strain:   . Difficulty of Paying Living Expenses:   Food Insecurity:   . Worried About Programme researcher, broadcasting/film/video in the Last Year:   . Barista in the Last Year:   Transportation Needs:   . Freight forwarder (Medical):   Marland Kitchen Lack of Transportation (Non-Medical):   Physical Activity:   . Days of Exercise per Week:   . Minutes of Exercise per Session:  Stress:   . Feeling of Stress :   Social Connections:   . Frequency of Communication with Friends and Family:   . Frequency of Social Gatherings with Friends and Family:   . Attends Religious Services:   . Active Member of Clubs or Organizations:   . Attends Archivist Meetings:   Marland Kitchen Marital Status:   Intimate Partner Violence:   . Fear of Current or Ex-Partner:   . Emotionally Abused:   Marland Kitchen Physically Abused:   . Sexually Abused:     Outpatient Medications Prior to Visit  Medication Sig Dispense Refill  . acetaminophen (TYLENOL) 500 MG tablet Take 1,000 mg by mouth every 6 (six) hours as needed for moderate pain.    Marland Kitchen albuterol (2.5 MG/3ML) 0.083% NEBU 3 mL, albuterol (5 MG/ML) 0.5% NEBU 0.5 mL Inhale into the lungs every 4 (four)  hours.    Marland Kitchen albuterol (VENTOLIN HFA) 108 (90 Base) MCG/ACT inhaler Inhale 1-2 puffs into the lungs every 6 (six) hours as needed for wheezing or shortness of breath.    Marland Kitchen amLODipine (NORVASC) 10 MG tablet Take 1 tablet (10 mg total) by mouth daily. 90 tablet 0  . azelastine (OPTIVAR) 0.05 % ophthalmic solution Place 1 drop into both eyes daily.    . ferrous sulfate 325 (65 FE) MG tablet Take 325 mg by mouth daily with breakfast.    . omeprazole (PRILOSEC) 40 MG capsule Take by mouth.    . oxyCODONE ER (XTAMPZA ER) 9 MG C12A Take by mouth.    . levothyroxine (SYNTHROID) 200 MCG tablet Take 1 tablet (200 mcg total) by mouth daily before breakfast. 90 tablet 0  . Oxycodone HCl 10 MG TABS Take 10 mg by mouth 4 (four) times daily as needed.    . ramipril (ALTACE) 10 MG capsule Take 1 capsule (10 mg total) by mouth daily. 90 capsule 0  . acetaminophen-codeine 120-12 MG/5ML solution Take 10 mLs by mouth every 4 (four) hours as needed for moderate pain (and cough). (Patient not taking: Reported on 07/15/2019) 240 mL 0  . Dulaglutide (TRULICITY) 0.73 XT/0.6YI SOPN Inject 0.75 mg into the skin once a week. (Patient not taking: Reported on 07/15/2019)    . DULoxetine (CYMBALTA) 20 MG capsule Take 20 mg by mouth every evening. (Patient not taking: Reported on 07/15/2019)    . Guaifenesin 1200 MG TB12 Take 1 tablet (1,200 mg total) by mouth 2 (two) times daily. (Patient not taking: Reported on 03/10/2019) 20 tablet 0  . methocarbamol (ROBAXIN) 500 MG tablet Take 1 tablet (500 mg total) by mouth 2 (two) times daily. (Patient not taking: Reported on 07/15/2019) 20 tablet 0  . naproxen (NAPROSYN) 500 MG tablet Take 1 tablet (500 mg total) by mouth 2 (two) times daily. (Patient not taking: Reported on 07/15/2019) 10 tablet 0  . Potassium 99 MG TABS Take 1 tablet by mouth daily. (Patient not taking: Reported on 07/15/2019)    . potassium chloride SA (KLOR-CON) 20 MEQ tablet Take 1 tablet (20 mEq total) by mouth daily.  (Patient not taking: Reported on 07/15/2019) 4 tablet 0  . promethazine (PHENERGAN) 25 MG tablet Take 1 tablet (25 mg total) by mouth every 6 (six) hours as needed for nausea or vomiting. (Patient not taking: Reported on 07/15/2019) 10 tablet 0  . Vitamin D, Ergocalciferol, (DRISDOL) 1.25 MG (50000 UNIT) CAPS capsule Take 50,000 Units by mouth every 7 (seven) days. (Patient not taking: Reported on 07/15/2019)    . benzonatate (TESSALON) 100  MG capsule Take 1 capsule (100 mg total) by mouth every 8 (eight) hours. 21 capsule 0  . calcium carbonate (OS-CAL - DOSED IN MG OF ELEMENTAL CALCIUM) 1250 (500 Ca) MG tablet Take 1 tablet by mouth daily.    . pantoprazole (PROTONIX) 40 MG tablet Take 1 tablet (40 mg total) by mouth daily. (Patient not taking: Reported on 07/15/2019) 90 tablet 0  . XTAMPZA ER 9 MG C12A Take 1 capsule by mouth 2 (two) times daily.     No facility-administered medications prior to visit.    Allergies  Allergen Reactions  . Shellfish Allergy Anaphylaxis  . Ace Inhibitors     ROS Review of Systems  Respiratory: Positive for cough and shortness of breath.   Cardiovascular: Positive for leg swelling.      Objective:    Physical Exam Constitutional:      General: She is in acute distress.     Appearance: She is obese. She is not ill-appearing or toxic-appearing.  HENT:     Head: Normocephalic and atraumatic.  Cardiovascular:     Rate and Rhythm: Normal rate.     Pulses: Normal pulses.     Heart sounds: Normal heart sounds.  Pulmonary:     Effort: Pulmonary effort is normal.     Breath sounds: Normal breath sounds.  Musculoskeletal:     Right lower leg: Edema present.     Left lower leg: Edema present.  Skin:    General: Skin is warm and dry.     Capillary Refill: Capillary refill takes less than 2 seconds.  Neurological:     General: No focal deficit present.     Mental Status: She is alert and oriented to person, place, and time.  Psychiatric:        Mood  and Affect: Mood normal.        Behavior: Behavior normal.        Thought Content: Thought content normal.        Judgment: Judgment normal.     BP 121/81 (BP Location: Left Arm, Patient Position: Sitting, Cuff Size: Large)   Pulse 72   Temp 98.8 F (37.1 C) (Temporal)   Ht 5\' 2"  (1.575 m)   Wt 235 lb 3.2 oz (106.7 kg)   LMP 06/28/2019   SpO2 100%   BMI 43.02 kg/m  Wt Readings from Last 3 Encounters:  07/15/19 235 lb 3.2 oz (106.7 kg)  06/28/19 235 lb (106.6 kg)  06/18/19 230 lb (104.3 kg)     Health Maintenance Due  Topic Date Due  . Hepatitis C Screening  Never done  . COVID-19 Vaccine (1) Never done  . HIV Screening  Never done  . PAP SMEAR-Modifier  Never done    There are no preventive care reminders to display for this patient.  Lab Results  Component Value Date   TSH 0.381 02/17/2019   Lab Results  Component Value Date   WBC 9.6 07/15/2019   HGB 9.6 (L) 07/15/2019   HCT 33.3 (L) 07/15/2019   MCV 71 (L) 07/15/2019   PLT 376 07/15/2019   Lab Results  Component Value Date   NA 141 07/15/2019   K 3.5 07/15/2019   CO2 24 07/03/2019   GLUCOSE 126 (H) 07/15/2019   BUN 6 07/15/2019   CREATININE 0.67 07/15/2019   BILITOT <0.2 07/15/2019   ALKPHOS 99 07/15/2019   AST 15 07/15/2019   ALT 18 06/28/2019   PROT 6.9 07/15/2019   ALBUMIN 4.0 07/15/2019  CALCIUM 9.7 07/15/2019   ANIONGAP 15 07/03/2019   No results found for: CHOL No results found for: HDL No results found for: LDLCALC No results found for: TRIG No results found for: [facility] Lab Results  Component Value Date   HGBA1C 6.2 (A) 07/15/2019   HGBA1C 6.2 07/15/2019   HGBA1C 6.2 07/15/2019   HGBA1C 6.2 07/15/2019      Assessment & Plan:   Problem List Items Addressed This Visit    None    Visit Diagnoses    Pre-diabetes    -  Primary   Relevant Orders   POCT Urinalysis Dipstick (Completed)   HgB A1c (Completed)   Hematuria, unspecified type       Hypertension, unspecified type        Relevant Orders   CBC with Differential/Platelet (Completed)   Comp. Metabolic Panel (12) (Completed)   Chronic pain syndrome       Relevant Medications   oxyCODONE ER (XTAMPZA ER) 9 MG C12A   Oxycodone HCl 10 MG TABS   XTAMPZA ER 9 MG C12A Instructed patient that this would be one time refill so she needs to continue with pain management and notify our office if she has not heard any pain in 1 to 2 weeks   Other Relevant Orders   Arthritis Panel (Completed)   Ambulatory referral to Pain Clinic   Asthma, unspecified asthma severity, unspecified whether complicated, unspecified whether persistent       Graves disease       Relevant Orders   Ambulatory referral to Endocrinology   Other iron deficiency anemia       Suspected sleep apnea       Relevant Orders   Ambulatory referral to Sleep Studies   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 Oxycodone HCl 10 MG TABS    Sig: Take 1 tablet (10 mg total) by mouth 2 (two) times daily.    Dispense:  60 tablet    Refill:  0    Order Specific Question:   Supervising Provider    Answer:   Quentin Angst [id number]  . XTAMPZA ER 9 MG C12A    Sig: Take 1 capsule by mouth 2 (two) times daily.    Dispense:  60 capsule    Refill:  0    Order Specific Question:   Supervising Provider    Answer:   Quentin Angst [[id number]]    Follow-up: No follow-ups on file.    Barbette Merino, NP

## 2019-07-16 ENCOUNTER — Encounter: Payer: Self-pay | Admitting: Nurse Practitioner

## 2019-07-16 ENCOUNTER — Other Ambulatory Visit: Payer: Self-pay

## 2019-07-16 ENCOUNTER — Encounter: Payer: Self-pay | Admitting: Physical Medicine and Rehabilitation

## 2019-07-16 DIAGNOSIS — E790 Hyperuricemia without signs of inflammatory arthritis and tophaceous disease: Secondary | ICD-10-CM | POA: Insufficient documentation

## 2019-07-16 LAB — COMP. METABOLIC PANEL (12)
AST: 15 IU/L (ref 0–40)
Albumin/Globulin Ratio: 1.4 (ref 1.2–2.2)
Albumin: 4 g/dL (ref 3.8–4.8)
Alkaline Phosphatase: 99 IU/L (ref 48–121)
BUN/Creatinine Ratio: 9 (ref 9–23)
BUN: 6 mg/dL (ref 6–24)
Bilirubin Total: <0.2 mg/dL (ref 0.0–1.2)
Calcium: 9.7 mg/dL (ref 8.7–10.2)
Chloride: 99 mmol/L (ref 96–106)
Creatinine, Ser: 0.67 mg/dL (ref 0.57–1.00)
GFR calc Af Amer: 123 mL/min/{1.73_m2} (ref >59)
GFR calc non Af Amer: 106 mL/min/{1.73_m2} (ref >59)
Globulin, Total: 2.9 g/dL (ref 1.5–4.5)
Glucose: 126 mg/dL — ABNORMAL HIGH (ref 65–99)
Potassium: 3.5 mmol/L (ref 3.5–5.2)
Sodium: 141 mmol/L (ref 134–144)
Total Protein: 6.9 g/dL (ref 6.0–8.5)

## 2019-07-16 LAB — CBC WITH DIFFERENTIAL/PLATELET
Basophils Absolute: 0 10*3/uL (ref 0.0–0.2)
Basos: 0 %
EOS (ABSOLUTE): 0.2 10*3/uL (ref 0.0–0.4)
Eos: 2 %
Hematocrit: 33.3 % — ABNORMAL LOW (ref 34.0–46.6)
Hemoglobin: 9.6 g/dL — ABNORMAL LOW (ref 11.1–15.9)
Immature Grans (Abs): 0 10*3/uL (ref 0.0–0.1)
Immature Granulocytes: 0 %
Lymphocytes Absolute: 2.5 10*3/uL (ref 0.7–3.1)
Lymphs: 26 %
MCH: 20.6 pg — ABNORMAL LOW (ref 26.6–33.0)
MCHC: 28.8 g/dL — ABNORMAL LOW (ref 31.5–35.7)
MCV: 71 fL — ABNORMAL LOW (ref 79–97)
Monocytes Absolute: 0.5 10*3/uL (ref 0.1–0.9)
Monocytes: 5 %
Neutrophils Absolute: 6.4 10*3/uL (ref 1.4–7.0)
Neutrophils: 67 %
Platelets: 376 10*3/uL (ref 150–450)
RBC: 4.67 x10E6/uL (ref 3.77–5.28)
RDW: 18 % — ABNORMAL HIGH (ref 11.7–15.4)
WBC: 9.6 10*3/uL (ref 3.4–10.8)

## 2019-07-16 LAB — ARTHRITIS PANEL
Anti Nuclear Antibody (ANA): NEGATIVE
Rheumatoid fact SerPl-aCnc: <10 IU/mL (ref 0.0–13.9)
Sed Rate: 13 mm/hr (ref 0–32)
Uric Acid: 6.5 mg/dL — ABNORMAL HIGH (ref 2.6–6.2)

## 2019-07-16 MED ORDER — LEVOTHYROXINE SODIUM 200 MCG PO TABS: 200.0000 ug | ORAL_TABLET | Freq: Every day | ORAL | 1 refills | Status: DC

## 2019-07-16 MED ORDER — PANTOPRAZOLE SODIUM 40 MG PO TBEC: 40.0000 mg | DELAYED_RELEASE_TABLET | Freq: Every day | ORAL | 1 refills | Status: DC

## 2019-07-16 MED FILL — LEVOTHYROXINE SODIUM 200 MC: 200 | 90 days supply | Qty: 90 | Fill #0

## 2019-07-16 MED FILL — PANTOPRAZOLE SOD DR 40 MG T: 40 | 90 days supply | Qty: 90 | Fill #0

## 2019-07-19 ENCOUNTER — Telehealth: Payer: Self-pay | Admitting: Nurse Practitioner

## 2019-07-19 NOTE — Telephone Encounter (Signed)
Pt called back regarding prior authorization for medication.  She states the one for xtampza needs a diagnosis and the hydrocodone needs to be listed as extended release or percocet. Please follow up with pt.

## 2019-07-21 NOTE — Telephone Encounter (Signed)
This was sent to [facility] through [place] [facility] , the POA is pending

## 2019-07-22 ENCOUNTER — Telehealth: Payer: Self-pay | Admitting: Nurse Practitioner

## 2019-07-22 NOTE — Telephone Encounter (Signed)
Pt called again regarding her prior authorization for xtampza needing a diagnosis

## 2019-07-27 ENCOUNTER — Other Ambulatory Visit: Payer: Self-pay | Admitting: Nurse Practitioner

## 2019-07-27 DIAGNOSIS — G894 Chronic pain syndrome: Secondary | ICD-10-CM

## 2019-07-27 MED ORDER — XTAMPZA ER 9 MG PO C12A: 1.0000 | EXTENDED_RELEASE_CAPSULE | Freq: Two times a day (BID) | ORAL | 0 refills | Status: AC

## 2019-07-27 NOTE — Progress Notes (Signed)
   [facility] Patient [facility] [address] [place], [place]  [address] Phone:  [phone]   Fax:  [phone]  New Rx needed due to insurance denial. PDMP reviewed.

## 2019-07-27 NOTE — Telephone Encounter (Signed)
Forwarding to [employer] and [employer]

## 2019-07-28 ENCOUNTER — Telehealth: Payer: Self-pay | Admitting: Nurse Practitioner

## 2019-07-28 ENCOUNTER — Ambulatory Visit (INDEPENDENT_AMBULATORY_CARE_PROVIDER_SITE_OTHER): Payer: Medicaid Other | Admitting: Family Medicine

## 2019-07-28 ENCOUNTER — Other Ambulatory Visit: Payer: Self-pay | Admitting: Nurse Practitioner

## 2019-07-28 MED ORDER — LEVOTHYROXINE SODIUM 200 MCG PO TABS: 200.0000 ug | ORAL_TABLET | Freq: Every day | ORAL | 1 refills | Status: DC

## 2019-07-28 NOTE — Telephone Encounter (Signed)
Thanks

## 2019-07-28 NOTE — Telephone Encounter (Signed)
I resent her medication in the Xtampza She needs to check with the Pharmacy. Is there anything additional that I need to be aware of? I also sent her a myChart message. She needs to make sure that she calls pain mgmt and schedule her apt.

## 2019-07-28 NOTE — Telephone Encounter (Signed)
Patient stated she did contact the pharmacy and her Marlowe Kays needs prior authorization.She did call her insurance and they stated it could be done through cover my meds so I will go in at attempted to get approval. She also did get an appointment with Pain mgmt but could not remember when it was.

## 2019-08-03 ENCOUNTER — Encounter
Payer: Medicaid Other | Attending: Physical Medicine and Rehabilitation | Admitting: Physical Medicine and Rehabilitation

## 2019-08-11 ENCOUNTER — Ambulatory Visit (INDEPENDENT_AMBULATORY_CARE_PROVIDER_SITE_OTHER): Payer: Medicaid Other | Admitting: Family Medicine

## 2019-08-16 ENCOUNTER — Ambulatory Visit: Payer: Medicaid Other | Admitting: Nurse Practitioner

## 2019-08-25 ENCOUNTER — Institutional Professional Consult (permissible substitution): Payer: Medicaid Other | Admitting: Plastic Surgery

## 2019-09-02 ENCOUNTER — Ambulatory Visit: Payer: Medicaid Other | Admitting: "Endocrinology

## 2019-09-08 ENCOUNTER — Encounter
Payer: Medicaid Other | Attending: Physical Medicine and Rehabilitation | Admitting: Physical Medicine and Rehabilitation

## 2019-09-30 ENCOUNTER — Ambulatory Visit (INDEPENDENT_AMBULATORY_CARE_PROVIDER_SITE_OTHER): Payer: Medicaid Other | Admitting: Nurse Practitioner

## 2019-09-30 ENCOUNTER — Encounter: Payer: Self-pay | Admitting: Nurse Practitioner

## 2019-09-30 ENCOUNTER — Other Ambulatory Visit: Payer: Self-pay

## 2019-09-30 VITALS — BP 153/91 | HR 94 | Temp 98.2°F | Resp 18 | Ht 63.0 in | Wt 243.8 lb

## 2019-09-30 DIAGNOSIS — G894 Chronic pain syndrome: Secondary | ICD-10-CM

## 2019-09-30 DIAGNOSIS — Z9119 Patient's noncompliance with other medical treatment and regimen: Secondary | ICD-10-CM

## 2019-09-30 DIAGNOSIS — I10 Essential (primary) hypertension: Secondary | ICD-10-CM

## 2019-09-30 DIAGNOSIS — J45909 Unspecified asthma, uncomplicated: Secondary | ICD-10-CM

## 2019-09-30 DIAGNOSIS — Z91199 Patient's noncompliance with other medical treatment and regimen due to unspecified reason: Secondary | ICD-10-CM

## 2019-09-30 DIAGNOSIS — Z1211 Encounter for screening for malignant neoplasm of colon: Secondary | ICD-10-CM

## 2019-09-30 DIAGNOSIS — E0500 Thyrotoxicosis with diffuse goiter without thyrotoxic crisis or storm: Secondary | ICD-10-CM

## 2019-09-30 MED ORDER — OMEPRAZOLE 40 MG PO CPDR: 40.0000 mg | DELAYED_RELEASE_CAPSULE | Freq: Every day | ORAL | 11 refills | Status: AC

## 2019-09-30 MED ORDER — AZELASTINE HCL 0.05 % OP SOLN: 2.0000 [drp] | Freq: Every day | OPHTHALMIC | 11 refills | Status: DC

## 2019-09-30 MED ORDER — LIRAGLUTIDE 18 MG/3ML ~~LOC~~ SOPN: 0.6000 mg | PEN_INJECTOR | SUBCUTANEOUS | 5 refills | Status: AC

## 2019-09-30 MED ORDER — ALBUTEROL SULFATE HFA 108 (90 BASE) MCG/ACT IN AERS: 1.0000 | INHALATION_SPRAY | Freq: Four times a day (QID) | RESPIRATORY_TRACT | 0 refills | Status: DC | PRN

## 2019-09-30 MED ORDER — DIAZEPAM 5 MG PO TABS: 5.0000 mg | ORAL_TABLET | Freq: Two times a day (BID) | ORAL | 0 refills | Status: AC | PRN

## 2019-09-30 NOTE — Progress Notes (Signed)
[facility] Patient [facility] [address] [place], [place]  [address] Phone:  [phone]   Fax:  [phone]   Established Patient Office Visit  Subjective:  Patient ID: Kristin Hurst, female    DOB: 05/24/1975  Age: 44 y.o. MRN: [id number]  CC:  Chief Complaint  Patient presents with   Follow-up    Pt states hospital  1st time 08/18/19& 7/2nd time8/20/-09/11/19..  Pt states she wants to discuss Refferals and medication. Pt states her whole body feels tight and she has not been to work in    Leg Swelling    x53months    HPI Memory Kristin Hurst presents for follow up. She  has a past medical history of Anemia, Asthma, Diabetes mellitus without complication (HCC), Graves disease, Hypertension, and Thyroid disease.   Kristin Hurst has multiple chronic conditions however she continues to be noncompliant with the request that she has requested.  She was seeking referrals to rheumatology, endocrinology, pain management and a number of other specialist however she has not followed up with anyone.  She admits that she has traveled to [place] on several occasions and ended up in the hospital.  She denies any new diagnoses . She admits that she was given Diazepam which helped with her pain. She is requesting a refill.  She continues to have multiple complaints of pain and swelling.  She is requesting something for weight loss. She admits that she took this in the past.    Past Medical History:  Diagnosis Date   Anemia    Asthma    Diabetes mellitus without complication (HCC)    Graves disease    Hypertension    Thyroid disease     Past Surgical History:  Procedure Laterality Date   THYROIDECTOMY      Family History  Problem Relation Age of Onset   Heart failure Mother    Diabetes Mother     Social History   Socioeconomic History   Marital status: Single    Spouse name: Not on file   Number of children: Not on file   Years of education: Not on file   Highest  education level: Not on file  Occupational History   Not on file  Tobacco Use   Smoking status: Current Every Day Smoker    Packs/day: 0.25    Types: Cigarettes   Smokeless tobacco: Never Used  Vaping Use   Vaping Use: Never used  Substance and Sexual Activity   Alcohol use: Yes    Comment: social    Drug use: Never   Sexual activity: Yes  Other Topics Concern   Not on file  Social History Narrative   Not on file   Social Determinants of Health   Financial Resource Strain:    Difficulty of Paying Living Expenses: Not on file  Food Insecurity:    Worried About Running Out of Food in the Last Year: Not on file   Ran Out of Food in the Last Year: Not on file  Transportation Needs:    Lack of Transportation (Medical): Not on file   Lack of Transportation (Non-Medical): Not on file  Physical Activity:    Days of Exercise per Week: Not on file   Minutes of Exercise per Session: Not on file  Stress:    Feeling of Stress : Not on file  Social Connections:    Frequency of Communication with Friends and Family: Not on file   Frequency of Social Gatherings with Friends  and Family: Not on file   Attends Religious Services: Not on file   Active Member of Clubs or Organizations: Not on file   Attends Banker Meetings: Not on file   Marital Status: Not on file  Intimate Partner Violence:    Fear of Current or Ex-Partner: Not on file   Emotionally Abused: Not on file   Physically Abused: Not on file   Sexually Abused: Not on file    Outpatient Medications Prior to Visit  Medication Sig Dispense Refill   acetaminophen (TYLENOL) 500 MG tablet Take 1,000 mg by mouth every 6 (six) hours as needed for moderate pain.     ferrous sulfate 325 (65 FE) MG tablet Take 325 mg by mouth daily with breakfast.     levothyroxine (SYNTHROID) 200 MCG tablet Take 1 tablet (200 mcg total) by mouth daily before breakfast. 90 tablet 1   Potassium 99 MG TABS  Take 1 tablet by mouth daily.      acetaminophen-codeine 120-12 MG/5ML solution Take 10 mLs by mouth every 4 (four) hours as needed for moderate pain (and cough). (Patient not taking: Reported on 07/15/2019) 240 mL 0   albuterol (2.5 MG/3ML) 0.083% NEBU 3 mL, albuterol (5 MG/ML) 0.5% NEBU 0.5 mL Inhale into the lungs every 4 (four) hours. (Patient not taking: Reported on 09/30/2019)     amLODipine (NORVASC) 10 MG tablet Take 1 tablet (10 mg total) by mouth daily. 90 tablet 0   Dulaglutide (TRULICITY) 0.75 MG/0.5ML SOPN Inject 0.75 mg into the skin once a week. (Patient not taking: Reported on 07/15/2019)     DULoxetine (CYMBALTA) 20 MG capsule Take 20 mg by mouth every evening. (Patient not taking: Reported on 07/15/2019)     Guaifenesin 1200 MG TB12 Take 1 tablet (1,200 mg total) by mouth 2 (two) times daily. (Patient not taking: Reported on 03/10/2019) 20 tablet 0   methocarbamol (ROBAXIN) 500 MG tablet Take 1 tablet (500 mg total) by mouth 2 (two) times daily. (Patient not taking: Reported on 07/15/2019) 20 tablet 0   naproxen (NAPROSYN) 500 MG tablet Take 1 tablet (500 mg total) by mouth 2 (two) times daily. (Patient not taking: Reported on 07/15/2019) 10 tablet 0   potassium chloride SA (KLOR-CON) 20 MEQ tablet Take 1 tablet (20 mEq total) by mouth daily. (Patient not taking: Reported on 07/15/2019) 4 tablet 0   promethazine (PHENERGAN) 25 MG tablet Take 1 tablet (25 mg total) by mouth every 6 (six) hours as needed for nausea or vomiting. (Patient not taking: Reported on 07/15/2019) 10 tablet 0   Vitamin D, Ergocalciferol, (DRISDOL) 1.25 MG (50000 UNIT) CAPS capsule Take 50,000 Units by mouth every 7 (seven) days. (Patient not taking: Reported on 07/15/2019)     albuterol (VENTOLIN HFA) 108 (90 Base) MCG/ACT inhaler Inhale 1-2 puffs into the lungs every 6 (six) hours as needed for wheezing or shortness of breath. (Patient not taking: Reported on 09/30/2019)     azelastine (OPTIVAR) 0.05 %  ophthalmic solution Place 1 drop into both eyes daily. (Patient not taking: Reported on 09/30/2019)     omeprazole (PRILOSEC) 40 MG capsule Take by mouth. (Patient not taking: Reported on 09/30/2019)     pantoprazole (PROTONIX) 40 MG tablet Take 1 tablet (40 mg total) by mouth daily. (Patient not taking: Reported on 09/30/2019) 90 tablet 1   No facility-administered medications prior to visit.    Allergies  Allergen Reactions   Shellfish Allergy Anaphylaxis   Ace Inhibitors  ROS Review of Systems    Objective:    Physical Exam Constitutional:      Appearance: She is obese.     Comments: Complaining of increased pain however able to talk to family during the visit and there was no observation of pain noted  HENT:     Head: Normocephalic and atraumatic.  Cardiovascular:     Rate and Rhythm: Normal rate and regular rhythm.     Pulses: Normal pulses.     Heart sounds: Normal heart sounds.  Pulmonary:     Effort: Pulmonary effort is normal.     Breath sounds: Normal breath sounds.  Abdominal:     Palpations: Abdomen is soft.  Musculoskeletal:        General: Normal range of motion.     Right lower leg: Edema present.     Left lower leg: Edema present.     Comments: Trace-1  Skin:    General: Skin is warm and dry.     Capillary Refill: Capillary refill takes less than 2 seconds.  Neurological:     General: No focal deficit present.     Mental Status: She is alert and oriented to person, place, and time.     BP (!) 153/91 (BP Location: Left Arm, Patient Position: Sitting, Cuff Size: Normal)    Pulse 94    Temp 98.2 F (36.8 C)    Resp 18    Ht 5\' 3"  (1.6 m)    Wt 243 lb 12.8 oz (110.6 kg)    LMP 09/22/2019 (Approximate)    SpO2 98%    BMI 43.19 kg/m  Wt Readings from Last 3 Encounters:  09/30/19 243 lb 12.8 oz (110.6 kg)  07/15/19 235 lb 3.2 oz (106.7 kg)  06/28/19 235 lb (106.6 kg)     Health Maintenance Due  Topic Date Due   Hepatitis C Screening  Never done     COVID-19 Vaccine (1) Never done   HIV Screening  Never done   PAP SMEAR-Modifier  Never done   INFLUENZA VACCINE  Never done    There are no preventive care reminders to display for this patient.  Lab Results  Component Value Date   TSH 0.381 02/17/2019   Lab Results  Component Value Date   WBC 9.6 07/15/2019   HGB 9.6 (L) 07/15/2019   HCT 33.3 (L) 07/15/2019   MCV 71 (L) 07/15/2019   PLT 376 07/15/2019   Lab Results  Component Value Date   NA 141 07/15/2019   K 3.5 07/15/2019   CO2 24 07/03/2019   GLUCOSE 126 (H) 07/15/2019   BUN 6 07/15/2019   CREATININE 0.67 07/15/2019   BILITOT <0.2 07/15/2019   ALKPHOS 99 07/15/2019   AST 15 07/15/2019   ALT 18 06/28/2019   PROT 6.9 07/15/2019   ALBUMIN 4.0 07/15/2019   CALCIUM 9.7 07/15/2019   ANIONGAP 15 07/03/2019   No results found for: CHOL No results found for: HDL No results found for: LDLCALC No results found for: TRIG No results found for: CHOLHDL Lab Results  Component Value Date   HGBA1C 6.2 (A) 07/15/2019   HGBA1C 6.2 07/15/2019   HGBA1C 6.2 07/15/2019   HGBA1C 6.2 07/15/2019      Assessment & Plan:   Problem List Items Addressed This Visit    None    Visit Diagnoses    Chronic pain syndrome    -  Primary I have provided her a one time refill on the Diazepam not  additional refills and not pain medications will be refilled. She verbalized understanding of the need to follow with pain management referral.     Relevant Orders   Ambulatory referral to Pain Clinic   Encounter for screening colonoscopy       Relevant Orders   Ambulatory referral to Gastroenterology   Hypertension, unspecified type     Encouraged on going compliance with current medication regimen Encouraged home monitoring and recording BP <130/80 Eating a heart-healthy diet with less salt Encouraged regular physical activity  Recommend Weight loss    Asthma, unspecified asthma severity, unspecified whether complicated,  unspecified whether persistent       Relevant Medications   albuterol (VENTOLIN HFA) 108 (90 Base) MCG/ACT inhaler   Graves disease  Endocrinology referral in place  Obesity     Refill on Liraglutide as requested for weight loss assistance.      Meds ordered this encounter  Medications   liraglutide (VICTOZA) 18 MG/3ML SOPN    Sig: Inject 0.6 mg into the skin once a week.    Dispense:  3 mL    Refill:  5    Order Specific Question:   Supervising Provider    Answer:   Quentin AngstJEGEDE, OLUGBEMIGA E [[id number]]   omeprazole (PRILOSEC) 40 MG capsule    Sig: Take 1 capsule (40 mg total) by mouth daily.    Dispense:  30 capsule    Refill:  11    Order Specific Question:   Supervising Provider    Answer:   Quentin AngstJEGEDE, OLUGBEMIGA E [[id number]]   azelastine (OPTIVAR) 0.05 % ophthalmic solution    Sig: Place 2 drops into both eyes daily.    Dispense:  6 mL    Refill:  11    Order Specific Question:   Supervising Provider    Answer:   Quentin AngstJEGEDE, OLUGBEMIGA E [[id number]]   albuterol (VENTOLIN HFA) 108 (90 Base) MCG/ACT inhaler    Sig: Inhale 1-2 puffs into the lungs every 6 (six) hours as needed for wheezing or shortness of breath.    Dispense:  8 g    Refill:  0    Order Specific Question:   Supervising Provider    Answer:   Quentin AngstJEGEDE, OLUGBEMIGA E [[id number]]   diazepam (VALIUM) 5 MG tablet    Sig: Take 1 tablet (5 mg total) by mouth every 12 (twelve) hours as needed for muscle spasms.    Dispense:  30 tablet    Refill:  0    No additional refills    Order Specific Question:   Supervising Provider    Answer:   Quentin AngstJEGEDE, OLUGBEMIGA E [[id number]]    Follow-up: Return in about 3 months (around 12/30/2019) for medical record release.    Barbette Merinorystal M Harmonee Tozer, NP

## 2019-10-01 ENCOUNTER — Telehealth: Payer: Self-pay

## 2019-10-06 ENCOUNTER — Other Ambulatory Visit: Payer: Self-pay | Admitting: Nurse Practitioner

## 2019-10-06 MED ORDER — PAZEO 0.7 % OP SOLN: 1.0000 [drp] | Freq: Two times a day (BID) | OPHTHALMIC | 11 refills | Status: AC

## 2019-10-14 ENCOUNTER — Ambulatory Visit: Payer: Medicaid Other | Admitting: Nurse Practitioner

## 2019-10-15 ENCOUNTER — Telehealth: Payer: Self-pay

## 2019-10-15 MED ORDER — HYDROCHLOROTHIAZIDE 25 MG PO TABS: 25.0000 mg | ORAL_TABLET | Freq: Every day | ORAL | 11 refills | Status: AC

## 2019-10-15 NOTE — Telephone Encounter (Signed)
Please make her aware thanks.

## 2019-10-15 NOTE — Telephone Encounter (Signed)
Referral was sent to pain management for this patient, but it was denied.   Please see referral

## 2019-10-18 ENCOUNTER — Ambulatory Visit: Payer: Medicaid Other | Admitting: Nurse Practitioner

## 2019-10-22 NOTE — Telephone Encounter (Signed)
Called [phone] number listed was son's number, son said will give message to mom to call office to discuss the need to be rescheduled to another office

## 2019-10-27 ENCOUNTER — Ambulatory Visit: Payer: Medicaid Other | Admitting: "Endocrinology

## 2019-11-07 ENCOUNTER — Emergency Department (HOSPITAL_COMMUNITY)
Admission: EM | Admit: 2019-11-07 | Discharge: 2019-11-07 | Disposition: A | Discharge status: Home / Self Care | Payer: Medicaid Other | Attending: Emergency Medicine | Admitting: Emergency Medicine

## 2019-11-07 ENCOUNTER — Encounter (HOSPITAL_COMMUNITY): Payer: Self-pay | Admitting: Emergency Medicine

## 2019-11-07 ENCOUNTER — Emergency Department (HOSPITAL_COMMUNITY): Payer: Medicaid Other

## 2019-11-07 ENCOUNTER — Other Ambulatory Visit: Payer: Self-pay

## 2019-11-07 DIAGNOSIS — F17210 Nicotine dependence, cigarettes, uncomplicated: Secondary | ICD-10-CM | POA: Diagnosis not present

## 2019-11-07 DIAGNOSIS — J45909 Unspecified asthma, uncomplicated: Secondary | ICD-10-CM | POA: Insufficient documentation

## 2019-11-07 DIAGNOSIS — E119 Type 2 diabetes mellitus without complications: Secondary | ICD-10-CM | POA: Diagnosis not present

## 2019-11-07 DIAGNOSIS — I10 Essential (primary) hypertension: Secondary | ICD-10-CM | POA: Insufficient documentation

## 2019-11-07 DIAGNOSIS — Z79899 Other long term (current) drug therapy: Secondary | ICD-10-CM | POA: Diagnosis not present

## 2019-11-07 DIAGNOSIS — R059 Cough, unspecified: Secondary | ICD-10-CM | POA: Insufficient documentation

## 2019-11-07 DIAGNOSIS — R0789 Other chest pain: Secondary | ICD-10-CM | POA: Diagnosis not present

## 2019-11-07 LAB — I-STAT BETA HCG BLOOD, ED (MC, WL, AP ONLY): I-stat hCG, quantitative: <5 m[IU]/mL (ref <5)

## 2019-11-07 LAB — BASIC METABOLIC PANEL
Anion gap: 9 (ref 5–15)
BUN: 12 mg/dL (ref 6–20)
CO2: 32 mmol/L (ref 22–32)
Calcium: 9.6 mg/dL (ref 8.9–10.3)
Chloride: 98 mmol/L (ref 98–111)
Creatinine, Ser: 0.72 mg/dL (ref 0.44–1.00)
GFR, Estimated: >60 mL/min (ref >60)
Glucose, Bld: 91 mg/dL (ref 70–99)
Potassium: 3.1 mmol/L — ABNORMAL LOW (ref 3.5–5.1)
Sodium: 139 mmol/L (ref 135–145)

## 2019-11-07 LAB — CBC
HCT: 40.4 % (ref 36.0–46.0)
Hemoglobin: 11.7 g/dL — ABNORMAL LOW (ref 12.0–15.0)
MCH: 22.2 pg — ABNORMAL LOW (ref 26.0–34.0)
MCHC: 29 g/dL — ABNORMAL LOW (ref 30.0–36.0)
MCV: 76.5 fL — ABNORMAL LOW (ref 80.0–100.0)
Platelets: 244 10*3/uL (ref 150–400)
RBC: 5.28 MIL/uL — ABNORMAL HIGH (ref 3.87–5.11)
RDW: 20.4 % — ABNORMAL HIGH (ref 11.5–15.5)
WBC: 10.1 10*3/uL (ref 4.0–10.5)
nRBC: 0 % (ref 0.0–0.2)

## 2019-11-07 LAB — TROPONIN I (HIGH SENSITIVITY)
Troponin I (High Sensitivity): 5 ng/L (ref <18)
Troponin I (High Sensitivity): 5 ng/L (ref <18)

## 2019-11-07 MED ORDER — MAGNESIUM OXIDE 400 (241.3 MG) MG PO TABS
400.0000 mg | ORAL_TABLET | Freq: Once | ORAL | Status: AC
  Administered 2019-11-07: 400 mg via ORAL
  Filled 2019-11-07: qty 1

## 2019-11-07 MED ORDER — POTASSIUM CHLORIDE CRYS ER 20 MEQ PO TBCR
40.0000 meq | EXTENDED_RELEASE_TABLET | Freq: Once | ORAL | Status: AC
  Administered 2019-11-07: 40 meq via ORAL
  Filled 2019-11-07: qty 2

## 2019-11-07 NOTE — ED Provider Notes (Signed)
[facility] EMERGENCY DEPARTMENT Provider Note   CSN: [id number] Arrival date & time: 11/07/19  1620     History Chief Complaint  Patient presents with  . Chest Pain    Kristin Hurst is a 44 y.o. female.  HPI    Patient with significant medical history of anemia, asthma, diabetes, hypertension, thyroid disease presents to the emergency department with chief complaint of chest tightness.  Patient states this morning she had a coughing fit and started to develop some chest tightness.  She states she generally has coughing fits due to her asthma and her smoking history but she mentions that she generally does not have chest tightness.  She then took some of her albuterol and nebulizing treatment which did not really help.  This concerned her and she came here to be evaluated.  Patient states she did not become diaphoretic, denies nausea or vomiting, paresthesias or weakness in the upper or lower extremities.  She denies exertion making the pain worse, denies any cardiac history, denies DVT or PE history, denies leg pain, leg swelling, recent travels or surgeries, is not on hormone therapy.  Patient explains she is actually feeling much better right now and has no complaints at this time.  Patient denies headache, fever, chills, shortness of breath, chest pain, dumping, nausea, vomiting, diarrhea.  Past Medical History:  Diagnosis Date  . Anemia   . Asthma   . Diabetes mellitus without complication (HCC)   . Graves disease   . Hypertension   . Thyroid disease     Patient Active Problem List   Diagnosis Date Noted  . Elevated uric acid in blood 07/16/2019    Past Surgical History:  Procedure Laterality Date  . THYROIDECTOMY       OB History   No obstetric history on file.     Family History  Problem Relation Age of Onset  . Heart failure Mother   . Diabetes Mother     Social History   Tobacco Use  . Smoking status: Current Every Day Smoker     Packs/day: 0.25    Types: Cigarettes  . Smokeless tobacco: Never Used  Vaping Use  . Vaping Use: Never used  Substance Use Topics  . Alcohol use: Yes    Comment: social   . Drug use: Never    Home Medications Prior to Admission medications   Medication Sig Start Date End Date Taking? Authorizing Provider  acetaminophen (TYLENOL) 500 MG tablet Take 1,000 mg by mouth every 6 (six) hours as needed for moderate pain.    [provider]  albuterol (VENTOLIN HFA) 108 (90 Base) MCG/ACT inhaler Inhale 1-2 puffs into the lungs every 6 (six) hours as needed for wheezing or shortness of breath. 09/30/19 09/29/20  Barbette Merino, NP  amLODipine (NORVASC) 10 MG tablet Take 1 tablet (10 mg total) by mouth daily. 03/10/19 07/15/19  Barbette Merino, NP  diazepam (VALIUM) 5 MG tablet Take 1 tablet (5 mg total) by mouth every 12 (twelve) hours as needed for muscle spasms. 09/30/19   Barbette Merino, NP  Dulaglutide (TRULICITY) 0.75 MG/0.5ML SOPN Inject 0.75 mg into the skin once a week. Patient not taking: Reported on 07/15/2019    [provider]  ferrous sulfate 325 (65 FE) MG tablet Take 325 mg by mouth daily with breakfast.    [provider]  hydrochlorothiazide (HYDRODIURIL) 25 MG tablet Take 1 tablet (25 mg total) by mouth daily. 10/15/19   Thad Ranger  M, NP  levothyroxine (SYNTHROID) 200 MCG tablet Take 1 tablet (200 mcg total) by mouth daily before breakfast. 07/28/19 10/26/19  Barbette Merino, NP  liraglutide (VICTOZA) 18 MG/3ML SOPN Inject 0.6 mg into the skin once a week. 09/30/19   Barbette Merino, NP  methocarbamol (ROBAXIN) 500 MG tablet Take 1 tablet (500 mg total) by mouth 2 (two) times daily. Patient not taking: Reported on 07/15/2019 06/19/19   Mannie Stabile, PA-C  Olopatadine HCl (PAZEO) 0.7 % SOLN Apply 1 drop to eye in the morning and at bedtime. 10/06/19   Barbette Merino, NP  omeprazole (PRILOSEC) 40 MG capsule Take 1 capsule (40 mg total) by mouth daily. 09/30/19  09/29/20  Barbette Merino, NP  Potassium 99 MG TABS Take 1 tablet by mouth daily.     [provider]    Allergies    Shellfish allergy and Ace inhibitors  Review of Systems   Review of Systems  Constitutional: Negative for chills and fever.  HENT: Negative for congestion, tinnitus, trouble swallowing and voice change.   Eyes: Negative for visual disturbance.  Respiratory: Positive for cough. Negative for shortness of breath.   Cardiovascular: Positive for chest pain. Negative for palpitations and leg swelling.  Gastrointestinal: Negative for abdominal pain, diarrhea, nausea and vomiting.  Genitourinary: Negative for dysuria, enuresis, flank pain and frequency.  Musculoskeletal: Negative for back pain and neck stiffness.  Skin: Negative for rash.  Neurological: Negative for dizziness and headaches.  Hematological: Does not bruise/bleed easily.    Physical Exam Updated Vital Signs BP (!) 153/110 (BP Location: Right Arm)   Pulse 89   Temp 98.5 F (36.9 C) (Oral)   Resp 16   Ht 5\' 3"  (1.6 m)   Wt 112.5 kg   LMP 10/22/2019   SpO2 98%   BMI 43.93 kg/m   Physical Exam Vitals and nursing note reviewed.  Constitutional:      General: She is not in acute distress.    Appearance: She is not ill-appearing.  HENT:     Head: Normocephalic and atraumatic.     Nose: No congestion.     Mouth/Throat:     Mouth: Mucous membranes are moist.     Pharynx: Oropharynx is clear.  Eyes:     General: No scleral icterus. Cardiovascular:     Rate and Rhythm: Normal rate and regular rhythm.     Pulses: Normal pulses.     Heart sounds: No murmur heard.  No friction rub. No gallop.   Pulmonary:     Effort: No respiratory distress.     Breath sounds: No wheezing, rhonchi or rales.  Abdominal:     General: There is no distension.     Palpations: Abdomen is soft.     Tenderness: There is no abdominal tenderness. There is no right CVA tenderness, left CVA tenderness or guarding.    Musculoskeletal:        General: No swelling.     Right lower leg: No edema.     Left lower leg: No edema.  Skin:    General: Skin is warm and dry.     Capillary Refill: Capillary refill takes less than 2 seconds.     Findings: No rash.  Neurological:     Mental Status: She is alert.  Psychiatric:        Mood and Affect: Mood normal.     ED Results / Procedures / Treatments   Labs (all labs ordered are listed,  but only abnormal results are displayed) Labs Reviewed  BASIC METABOLIC PANEL - Abnormal; Notable for the following components:      Result Value   Potassium 3.1 (*)    All other components within normal limits  CBC - Abnormal; Notable for the following components:   RBC 5.28 (*)    Hemoglobin 11.7 (*)    MCV 76.5 (*)    MCH 22.2 (*)    MCHC 29.0 (*)    RDW 20.4 (*)    All other components within normal limits  I-STAT BETA HCG BLOOD, ED ([facility], [facility], [facility] ONLY)  TROPONIN I (HIGH SENSITIVITY)  TROPONIN I (HIGH SENSITIVITY)    EKG None  Radiology DG Chest 2 View  Result Date: 11/07/2019 CLINICAL DATA:  Chest pain and shortness of breath EXAM: CHEST - 2 VIEW COMPARISON:  July 03, 2019 FINDINGS: Lungs are clear. Heart size and pulmonary vascularity are normal. No adenopathy. There are surgical clips in the thyroid region. Apparent hiatal hernia. IMPRESSION: Lungs clear. Heart size within normal limits. No adenopathy. Apparent hiatal hernia. Electronically Signed   By: Bretta BangWilliam  Woodruff III M.D.   On: 11/07/2019 16:50    Procedures Procedures (including critical care time)  Medications Ordered in ED Medications  potassium chloride SA (KLOR-CON) CR tablet 40 mEq (40 mEq Oral Given 11/07/19 2040)  magnesium oxide (MAG-OX) tablet 400 mg (400 mg Oral Given 11/07/19 2040)    ED Course  I have reviewed the triage vital signs and the nursing notes.  Pertinent labs & imaging results that were available during my care of the patient were reviewed by me and considered in  my medical decision making (see chart for details).    MDM Rules/Calculators/A&P                          I have personally reviewed all imaging, labs and have interpreted them.  Patient presents with chief complaint of chest pain.  She is alert, did not appear in acute distress, vital signs reassuring, will order chest work-up and chest x-ray with EKG for further evaluation.  CBC negative for leukocytosis, microcytic anemia at 11.7 which appears to be at baseline for patient.  BMP showing hypokalemia of 3.1, no metabolic acidosis, no AKI or anion gap noted.  Beta hCG less than 5, negative delta troponin.  Chest x-ray does not show acute findings, EKG was sinus rhythm without signs of ischemia no ST elevation or depression noted.  Due to low potassium will provide supplemental potassium and magnesium.  I have low suspicion for ACS as patient has a heart score of 3, history is atypical, she has no cardiac history, EKG was sinus rhythm without signs of ischemia, patient had a negative delta troponin. Low suspicion for PE as patient denies pleuritic chest pain, shortness of breath, leg pain, no pedal edema noted on exam, she is PERC negative.  Low suspicion for AAA or aortic dissection as history is atypical, she has low risk factors.  Low suspicion for systemic infection as patient is nontoxic-appearing, vital signs reassuring, no obvious source infection on exam.  Low suspicion for asthma exacerbation as lung sounds are clear bilaterally, no signs of respiratory distress, satting at 98% room air.  I suspect hypokalemia is secondary to her taking HCTZ which she takes daily for her hypertension.  Provided her with oral potassium and magnesium.  I suspect patient may have had a asthma exacerbation which has resolved since she gave  herself a nebulizing treatment.  Will recommend that she follows up with PCP for further evaluation management.  Patient's vital signs have remained stable no indication for  hospital admission.  Patient discussed with attending who agrees with assessment and plan.  Patient given at home care strict return precautions.  Patient verbalized understood and agreed to plan.     Final Clinical Impression(s) / ED Diagnoses Final diagnoses:  Atypical chest pain    Rx / DC Orders ED Discharge Orders    None       Carroll Sage, PA-C 11/07/19 2050    Melene Plan, DO 11/07/19 2052

## 2019-11-07 NOTE — ED Triage Notes (Signed)
Pt reports tightness to center of chest x 2 days.  History of asthma.  Also reports sob, cough, and wheezing.  Using nebulizer and inhaler without relief.

## 2019-11-07 NOTE — Discharge Instructions (Addendum)
Seen here for chest pain, lab work and imaging all looks reassuring.  Recommend continuing with your home medications.  Please follow-up with your PCP for further evaluation management.  Come back to the emergency department if you develop chest pain, shortness of breath, severe abdominal pain, uncontrolled nausea, vomiting, diarrhea.

## 2019-11-12 ENCOUNTER — Ambulatory Visit: Payer: Medicaid Other | Admitting: "Endocrinology

## 2019-11-18 ENCOUNTER — Other Ambulatory Visit: Payer: Self-pay | Admitting: Nurse Practitioner

## 2019-12-08 ENCOUNTER — Institutional Professional Consult (permissible substitution): Payer: Medicaid Other | Admitting: Plastic Surgery

## 2019-12-23 ENCOUNTER — Ambulatory Visit: Payer: Medicaid Other | Admitting: Nurse Practitioner

## 2020-01-03 ENCOUNTER — Other Ambulatory Visit: Payer: Self-pay | Admitting: Nurse Practitioner

## 2020-01-03 NOTE — Telephone Encounter (Signed)
Please see refill request.

## 2020-02-03 ENCOUNTER — Other Ambulatory Visit: Payer: Self-pay

## 2020-02-03 MED ORDER — LEVOTHYROXINE SODIUM 200 MCG PO TABS: 200.0000 ug | ORAL_TABLET | Freq: Every day | ORAL | 1 refills | Status: AC

## 2020-03-06 IMAGING — DX DG CHEST 1V PORT
1 series · 1 of 1 positions shown · non-contrast
Comparison: None.

CLINICAL DATA: Shortness of breath

EXAM:
PORTABLE CHEST 1 VIEW

[chest ap]
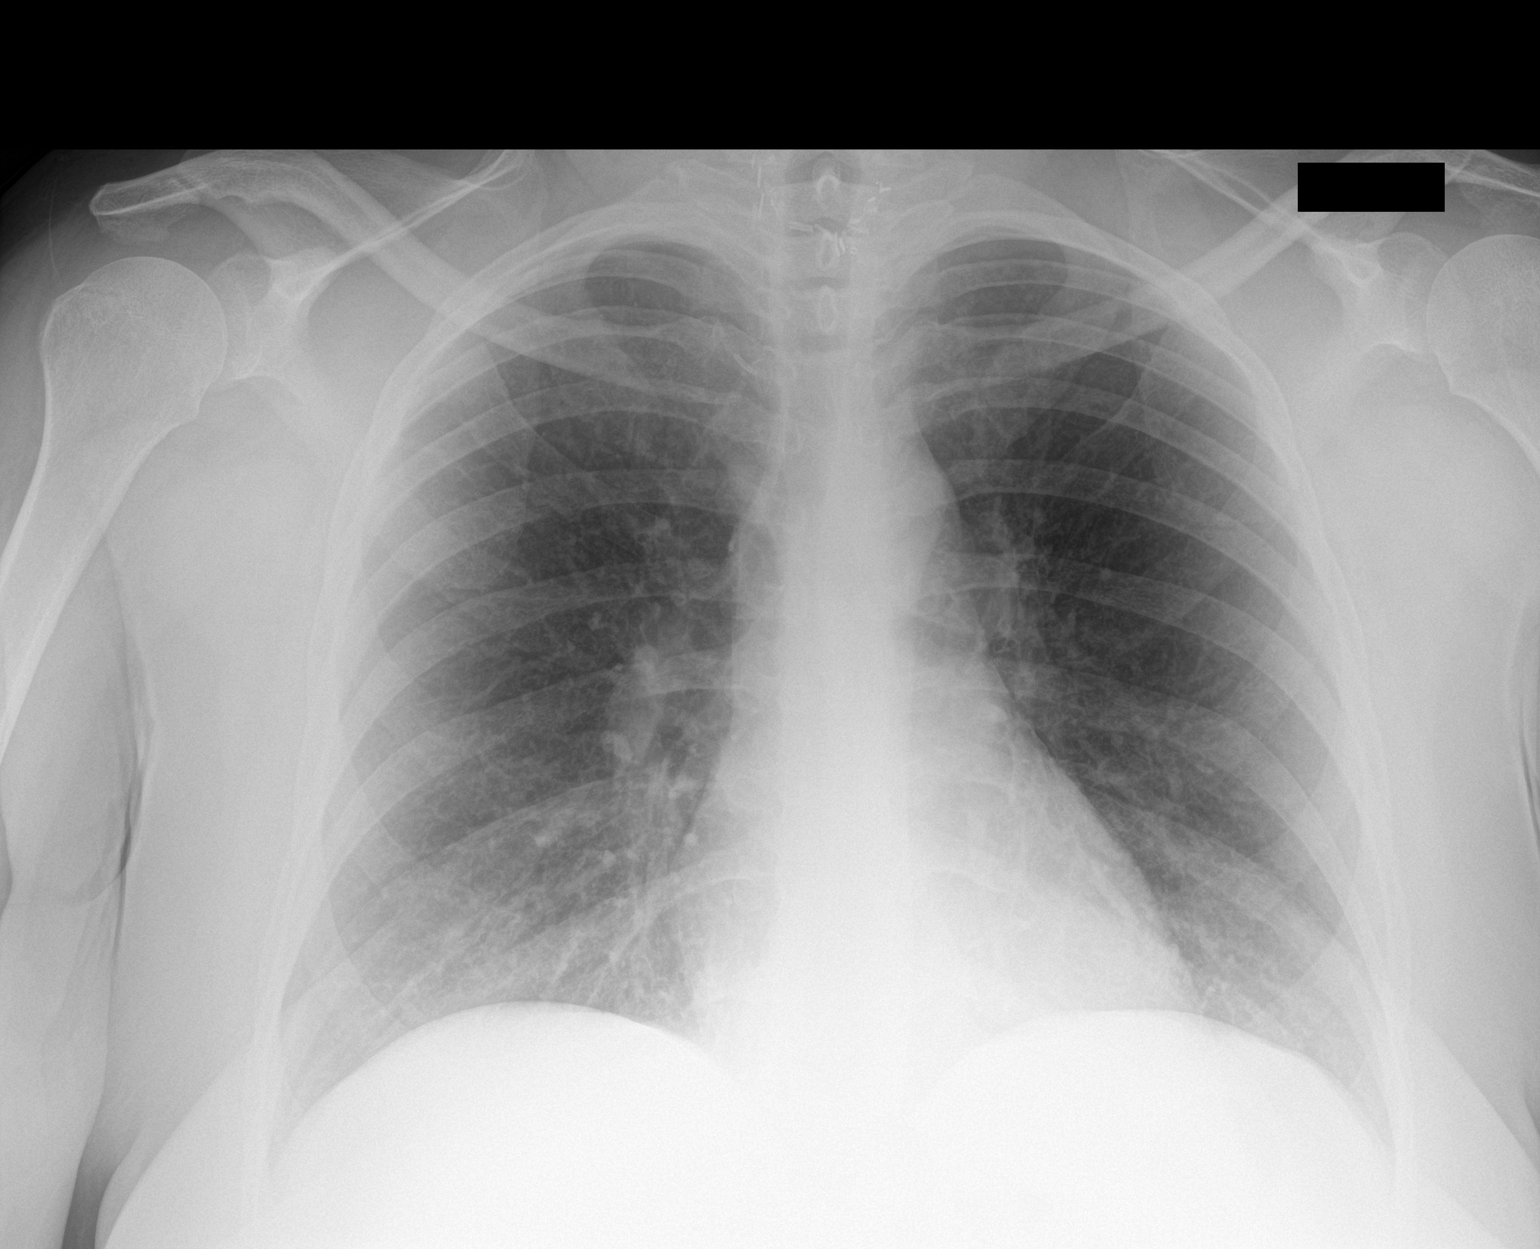

[1 of 1 positions shown; findings below may reference images not displayed]

FINDINGS: The heart size and mediastinal contours are within normal limits.
Both lungs are clear. The visualized skeletal structures are
unremarkable. Postsurgical changes at the thoracic inlet.
IMPRESSION: No active disease.

## 2020-03-31 IMAGING — DX DG CHEST 1V PORT
1 series · 1 of 1 positions shown · non-contrast
Comparison: 02/17/2019

CLINICAL DATA: Cough, short of breath

EXAM:
PORTABLE CHEST 1 VIEW

[chest]
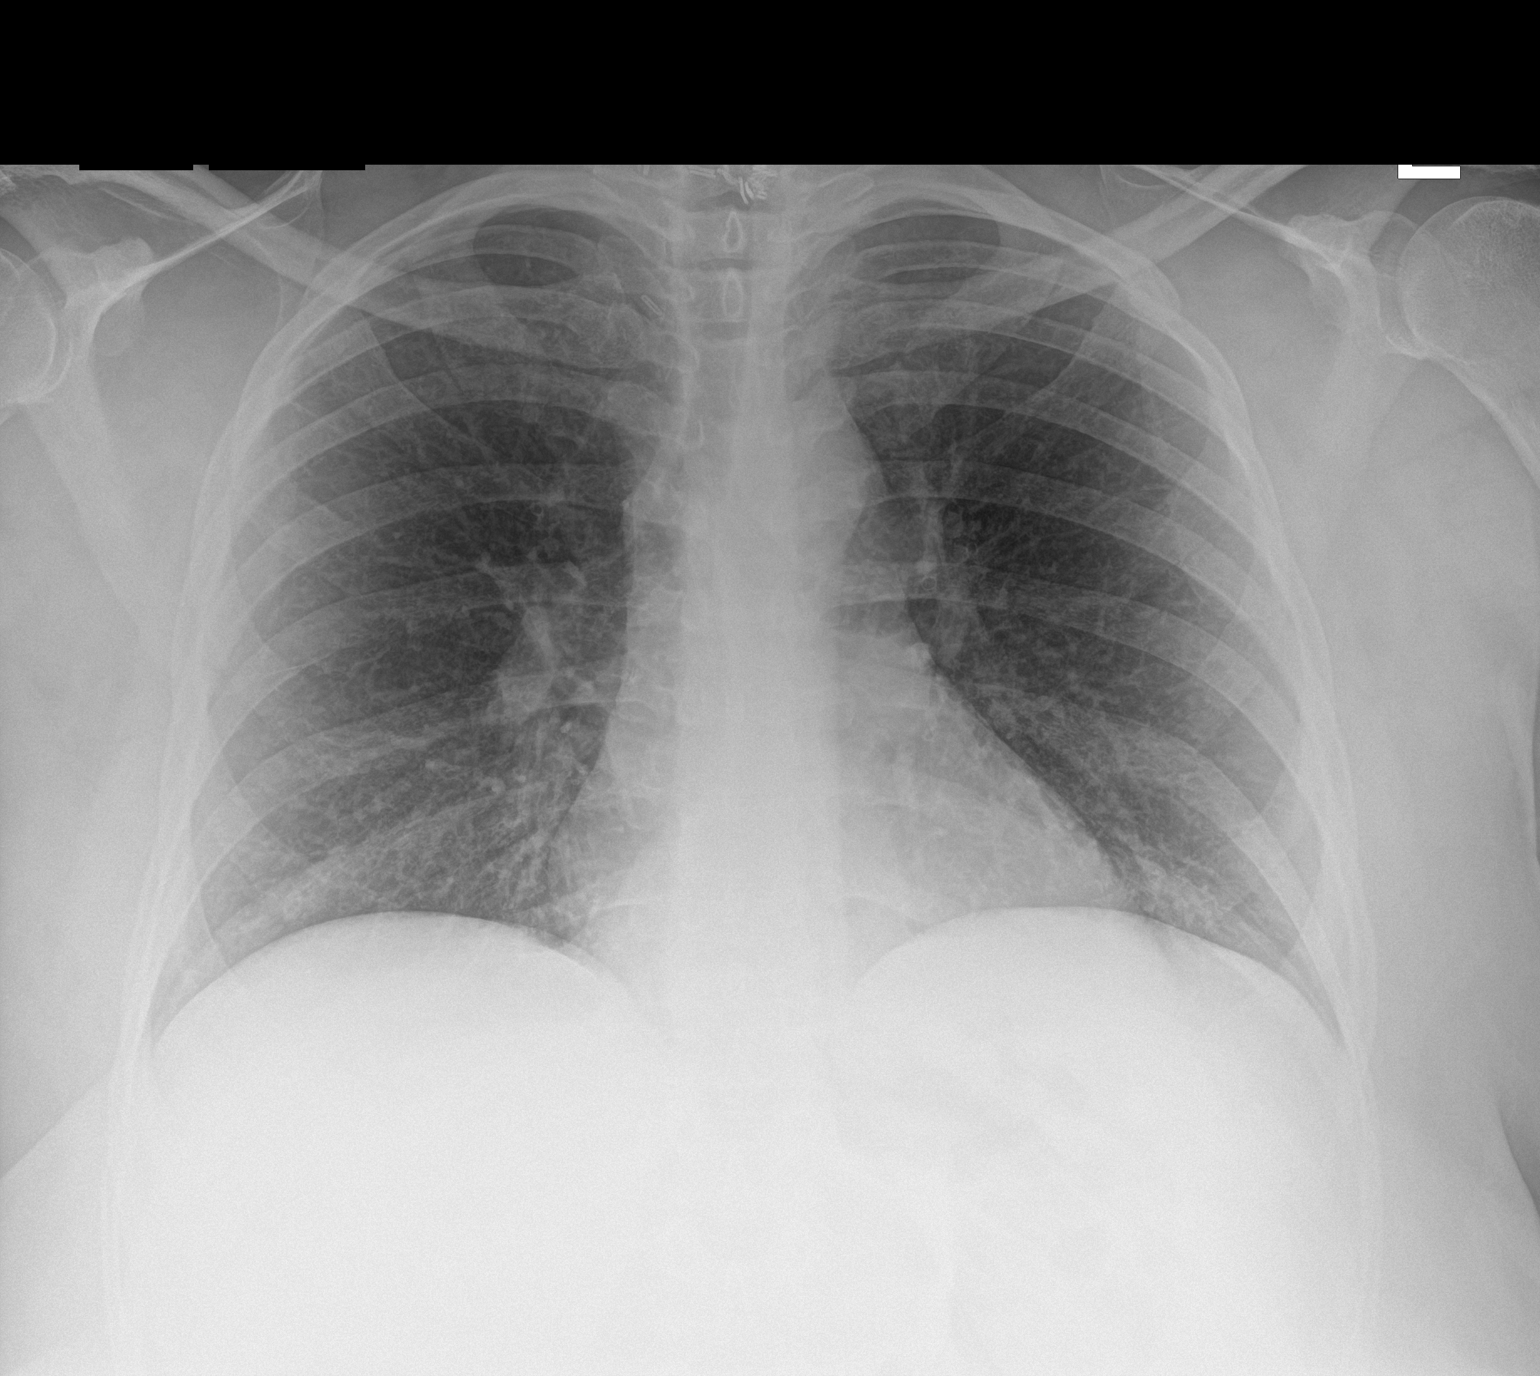

[1 of 1 positions shown; findings below may reference images not displayed]

FINDINGS: The heart size and mediastinal contours are within normal limits. No
focal consolidation or pleural effusion. Minimal basilar
atelectasis. Stable cardiomediastinal silhouette. No pneumothorax.
Postsurgical changes at the thoracic inlet.
IMPRESSION: No active disease.

## 2020-04-03 ENCOUNTER — Other Ambulatory Visit: Payer: Self-pay | Admitting: Nurse Practitioner

## 2020-06-05 ENCOUNTER — Emergency Department (HOSPITAL_BASED_OUTPATIENT_CLINIC_OR_DEPARTMENT_OTHER): Payer: Medicaid Other | Admitting: Radiology

## 2020-06-05 ENCOUNTER — Encounter (HOSPITAL_BASED_OUTPATIENT_CLINIC_OR_DEPARTMENT_OTHER): Payer: Self-pay | Admitting: Emergency Medicine

## 2020-06-05 ENCOUNTER — Other Ambulatory Visit: Payer: Self-pay

## 2020-06-05 ENCOUNTER — Emergency Department (HOSPITAL_BASED_OUTPATIENT_CLINIC_OR_DEPARTMENT_OTHER)
Admission: EM | Admit: 2020-06-05 | Discharge: 2020-06-05 | Disposition: A | Discharge status: Home / Self Care | Payer: Medicaid Other | Attending: Emergency Medicine | Admitting: Emergency Medicine

## 2020-06-05 DIAGNOSIS — J45909 Unspecified asthma, uncomplicated: Secondary | ICD-10-CM | POA: Diagnosis not present

## 2020-06-05 DIAGNOSIS — R079 Chest pain, unspecified: Secondary | ICD-10-CM | POA: Insufficient documentation

## 2020-06-05 DIAGNOSIS — Z79899 Other long term (current) drug therapy: Secondary | ICD-10-CM | POA: Insufficient documentation

## 2020-06-05 DIAGNOSIS — E119 Type 2 diabetes mellitus without complications: Secondary | ICD-10-CM | POA: Diagnosis not present

## 2020-06-05 DIAGNOSIS — R202 Paresthesia of skin: Secondary | ICD-10-CM | POA: Insufficient documentation

## 2020-06-05 DIAGNOSIS — M791 Myalgia, unspecified site: Secondary | ICD-10-CM | POA: Diagnosis not present

## 2020-06-05 DIAGNOSIS — F17210 Nicotine dependence, cigarettes, uncomplicated: Secondary | ICD-10-CM | POA: Diagnosis not present

## 2020-06-05 DIAGNOSIS — M25512 Pain in left shoulder: Secondary | ICD-10-CM | POA: Insufficient documentation

## 2020-06-05 DIAGNOSIS — I10 Essential (primary) hypertension: Secondary | ICD-10-CM | POA: Diagnosis not present

## 2020-06-05 DIAGNOSIS — Z794 Long term (current) use of insulin: Secondary | ICD-10-CM | POA: Insufficient documentation

## 2020-06-05 LAB — CBC
HCT: 32.8 % — ABNORMAL LOW (ref 36.0–46.0)
Hemoglobin: 9.6 g/dL — ABNORMAL LOW (ref 12.0–15.0)
MCH: 20.8 pg — ABNORMAL LOW (ref 26.0–34.0)
MCHC: 29.3 g/dL — ABNORMAL LOW (ref 30.0–36.0)
MCV: 71.1 fL — ABNORMAL LOW (ref 80.0–100.0)
Platelets: 228 10*3/uL (ref 150–400)
RBC: 4.61 MIL/uL (ref 3.87–5.11)
RDW: 19 % — ABNORMAL HIGH (ref 11.5–15.5)
WBC: 11.5 10*3/uL — ABNORMAL HIGH (ref 4.0–10.5)
nRBC: 0 % (ref 0.0–0.2)

## 2020-06-05 LAB — BASIC METABOLIC PANEL
Anion gap: 10 (ref 5–15)
BUN: 14 mg/dL (ref 6–20)
CO2: 25 mmol/L (ref 22–32)
Calcium: 9.6 mg/dL (ref 8.9–10.3)
Chloride: 100 mmol/L (ref 98–111)
Creatinine, Ser: 0.65 mg/dL (ref 0.44–1.00)
GFR, Estimated: >60 mL/min (ref >60)
Glucose, Bld: 145 mg/dL — ABNORMAL HIGH (ref 70–99)
Potassium: 4.1 mmol/L (ref 3.5–5.1)
Sodium: 135 mmol/L (ref 135–145)

## 2020-06-05 LAB — TROPONIN I (HIGH SENSITIVITY)
Troponin I (High Sensitivity): 5 ng/L (ref <18)
Troponin I (High Sensitivity): 6 ng/L (ref <18)

## 2020-06-05 MED ORDER — KETOROLAC TROMETHAMINE 15 MG/ML IJ SOLN
15.0000 mg | Freq: Once | INTRAMUSCULAR | Status: AC
  Administered 2020-06-05: 15 mg via INTRAVENOUS
  Filled 2020-06-05: qty 1

## 2020-06-05 MED ORDER — PREDNISONE 50 MG PO TABS
60.0000 mg | ORAL_TABLET | Freq: Once | ORAL | Status: AC
  Administered 2020-06-05: 60 mg via ORAL
  Filled 2020-06-05: qty 1

## 2020-06-05 MED ORDER — HYDROCODONE-ACETAMINOPHEN 5-325 MG PO TABS: 1.0000 | ORAL_TABLET | Freq: Four times a day (QID) | ORAL | 0 refills | Status: AC | PRN

## 2020-06-05 MED ORDER — ONDANSETRON HCL 4 MG/2ML IJ SOLN
4.0000 mg | Freq: Once | INTRAMUSCULAR | Status: AC
  Administered 2020-06-05: 4 mg via INTRAVENOUS
  Filled 2020-06-05: qty 2

## 2020-06-05 MED ORDER — HYDROMORPHONE HCL 1 MG/ML IJ SOLN
1.0000 mg | Freq: Once | INTRAMUSCULAR | Status: AC
  Administered 2020-06-05: 1 mg via INTRAVENOUS
  Filled 2020-06-05: qty 1

## 2020-06-05 MED ORDER — PREDNISONE 20 MG PO TABS: 40.0000 mg | ORAL_TABLET | Freq: Every day | ORAL | 0 refills | Status: AC

## 2020-06-05 NOTE — ED Triage Notes (Signed)
Pt arrives pov with c/o sharp, mid sternal pain with L arm radiation x 2 days pta. Pt denies injury, denies fever or shob.

## 2020-06-05 NOTE — ED Provider Notes (Signed)
[facility] EMERGENCY DEPT Provider Note   CSN: [id number] Arrival date & time: 06/05/20  1801     History Chief Complaint  Patient presents with  . Chest Pain    Ardys Hataway is a 45 y.o. female.  Patient is a 45 year old female with a history of asthma, RA, Graves' disease, diabetes, daily tobacco use who is presenting today for worsening left shoulder pain.  She reports the shoulder pain started 2 days ago and it has progressively worsened despite taking ibuprofen and Tylenol.  The pain is sharp and stabbing.  It is worse with movement of the arm.  It radiates into the left side of her chest but it is not made worse by pushing on the chest or taking a deep breath.  She has not recently had cough, congestion or fever.  She has not noticed any swelling in her shoulder.  She has a lot of body aches all the time and does have pain and tingling in her fingers bilaterally.  Today she did notice that she dropped something and she may have some mild weakness in her hand.  She has not had any shortness of breath or wheezing.  She does have some hydrocodone at home and she even tried taking that but the pain was not improved.  She has been under more stress because of some medical conditions with her mom but did not think that that would be causing all of the symptoms.  She does not take specific medication for her RA.  She herself has never had DVT, PE or heart disease.  The history is provided by the patient.  Chest Pain Associated symptoms: no abdominal pain, no cough, no diaphoresis, no dizziness, no fever, no lower extremity edema, no shortness of breath, no vomiting and no weakness        Past Medical History:  Diagnosis Date  . Anemia   . Asthma   . Diabetes mellitus without complication (HCC)   . Graves disease   . Hypertension   . Thyroid disease     Patient Active Problem List   Diagnosis Date Noted  . Elevated uric acid in blood 07/16/2019    Past Surgical  History:  Procedure Laterality Date  . THYROIDECTOMY       OB History   No obstetric history on file.     Family History  Problem Relation Age of Onset  . Heart failure Mother   . Diabetes Mother     Social History   Tobacco Use  . Smoking status: Current Every Day Smoker    Packs/day: 0.25    Types: Cigarettes  . Smokeless tobacco: Never Used  Vaping Use  . Vaping Use: Never used  Substance Use Topics  . Alcohol use: Yes    Comment: social   . Drug use: Never    Home Medications Prior to Admission medications   Medication Sig Start Date End Date Taking? Authorizing Provider  acetaminophen (TYLENOL) 500 MG tablet Take 1,000 mg by mouth every 6 (six) hours as needed for moderate pain.    [provider]  amLODipine (NORVASC) 10 MG tablet Take 1 tablet (10 mg total) by mouth daily. 03/10/19 07/15/19  Barbette Merino, NP  diazepam (VALIUM) 5 MG tablet Take 1 tablet (5 mg total) by mouth every 12 (twelve) hours as needed for muscle spasms. 09/30/19   Barbette Merino, NP  Dulaglutide (TRULICITY) 0.75 MG/0.5ML SOPN Inject 0.75 mg into the skin once a week. Patient not  taking: Reported on 07/15/2019    [provider]  ferrous sulfate 325 (65 FE) MG tablet Take 325 mg by mouth daily with breakfast.    [provider]  hydrochlorothiazide (HYDRODIURIL) 25 MG tablet Take 1 tablet (25 mg total) by mouth daily. 10/15/19   Barbette Merino, NP  levothyroxine (SYNTHROID) 200 MCG tablet Take 1 tablet (200 mcg total) by mouth daily before breakfast. 02/03/20 05/03/20  Barbette Merino, NP  liraglutide (VICTOZA) 18 MG/3ML SOPN Inject 0.6 mg into the skin once a week. 09/30/19   Barbette Merino, NP  methocarbamol (ROBAXIN) 500 MG tablet Take 1 tablet (500 mg total) by mouth 2 (two) times daily. Patient not taking: Reported on 07/15/2019 06/19/19   Mannie Stabile, PA-C  Olopatadine HCl (PAZEO) 0.7 % SOLN Apply 1 drop to eye in the morning and at bedtime. 10/06/19   Barbette Merino, NP  omeprazole (PRILOSEC) 40 MG capsule Take 1 capsule (40 mg total) by mouth daily. 09/30/19 09/29/20  Barbette Merino, NP  Potassium 99 MG TABS Take 1 tablet by mouth daily.     [provider]  PROAIR HFA 108 [phone] Base) MCG/ACT inhaler INHALE 1 TO 2 PUFFS INTO THE LUNGS EVERY 6 HOURS AS NEEDED FOR WHEEZING OR SHORTNESS OF BREATH 01/03/20   Barbette Merino, NP    Allergies    Shellfish allergy and Ace inhibitors  Review of Systems   Review of Systems  Constitutional: Negative for diaphoresis and fever.  Respiratory: Negative for cough and shortness of breath.   Cardiovascular: Positive for chest pain.  Gastrointestinal: Negative for abdominal pain and vomiting.  Neurological: Negative for dizziness and weakness.  All other systems reviewed and are negative.   Physical Exam Updated Vital Signs BP (!) 150/85 (BP Location: Right Arm)   Pulse 90   Temp 98.7 F (37.1 C) (Oral)   Resp 20   Ht 5\' 2"  (1.575 m)   Wt 101.2 kg   LMP 05/21/2020   SpO2 100%   BMI 40.79 kg/m   Physical Exam Vitals and nursing note reviewed.  Constitutional:      General: She is not in acute distress.    Appearance: She is well-developed.  HENT:     Head: Normocephalic and atraumatic.  Eyes:     Pupils: Pupils are equal, round, and reactive to light.  Cardiovascular:     Rate and Rhythm: Normal rate and regular rhythm.     Pulses: Normal pulses.     Heart sounds: Normal heart sounds. No murmur heard. No friction rub.  Pulmonary:     Effort: Pulmonary effort is normal.     Breath sounds: Normal breath sounds. No wheezing or rales.  Chest:     Chest wall: No tenderness.  Abdominal:     General: Bowel sounds are normal. There is no distension.     Palpations: Abdomen is soft.     Tenderness: There is no abdominal tenderness. There is no guarding or rebound.  Musculoskeletal:        General: Tenderness present.     Left shoulder: Tenderness and bony tenderness present.  Decreased range of motion.       Arms:     Comments: No edema  Skin:    General: Skin is warm and dry.     Findings: No rash.  Neurological:     Mental Status: She is alert and oriented to person, place, and time. Mental status is at baseline.  Cranial Nerves: No cranial nerve deficit.     Sensory: No sensory deficit.     Motor: No weakness.  Psychiatric:        Mood and Affect: Mood normal.        Behavior: Behavior normal.     ED Results / Procedures / Treatments   Labs (all labs ordered are listed, but only abnormal results are displayed) Labs Reviewed  BASIC METABOLIC PANEL - Abnormal; Notable for the following components:      Result Value   Glucose, Bld 145 (*)    All other components within normal limits  CBC - Abnormal; Notable for the following components:   WBC 11.5 (*)    Hemoglobin 9.6 (*)    HCT 32.8 (*)    MCV 71.1 (*)    MCH 20.8 (*)    MCHC 29.3 (*)    RDW 19.0 (*)    All other components within normal limits  PREGNANCY, URINE  TROPONIN I (HIGH SENSITIVITY)    EKG EKG Interpretation  Date/Time:  Monday Jun 05 2020 18:14:45 EDT Ventricular Rate:  97 PR Interval:  156 QRS Duration: 76 QT Interval:  356 QTC Calculation: 452 R Axis:   42 Text Interpretation: Normal sinus rhythm Cannot rule out Anterior infarct , age undetermined No significant change since last tracing Confirmed by Gwyneth SproutPlunkett, Gatlyn Lipari ([id number]) on 06/05/2020 6:25:35 PM   Radiology No results found.  Procedures Procedures   Medications Ordered in ED Medications  HYDROmorphone (DILAUDID) injection 1 mg (has no administration in time range)  ondansetron (ZOFRAN) injection 4 mg (has no administration in time range)    ED Course  I have reviewed the triage vital signs and the nursing notes.  Pertinent labs & imaging results that were available during my care of the patient were reviewed by me and considered in my medical decision making (see chart for details).    MDM  Rules/Calculators/A&P                          Patient presenting today with left shoulder pain that does radiate into her chest that was worsens for the last 2 days.  Patient has no obvious evidence of shoulder swelling, fever or signs for septic arthritis.  Pain definitely seems to be at the shoulder and is worse with movement of the arm and palpation.  Feel that the pain in her chest is radiating from her shoulder.  She has no heart related complaints such as shortness of breath, diaphoresis, exertional symptoms.  Suspect that this is exacerbation of her rheumatoid arthritis.  She has tried taking Tylenol and ibuprofen at home and even hydrocodone with no improvement.  She is neurovascularly intact at this time and has no notable weakness.  BMP without acute findings, CBC with white count of 11.5, hemoglobin of 9.6 which seems to be within her normal range and negative troponin of 5.  EKG without acute findings.  Low suspicion at this time for clot.  Patient given pain control.  Chest x-ray pending.  Patient's pain has been constant for the last 24 hours and with a negative troponin do not feel that she needs another.  10:22 PM Pain was improved after Toradol.  Will discharge home with short course of prednisone and pain medication to use if Aleve is not working.  Encourage follow-up with PCP.  MDM Number of Diagnoses or Management Options   Amount and/or Complexity of Data Reviewed Clinical lab tests:  reviewed and ordered Tests in the radiology section of CPT: ordered and reviewed Tests in the medicine section of CPT: reviewed and ordered Independent visualization of images, tracings, or specimens: yes    Final Clinical Impression(s) / ED Diagnoses Final diagnoses:  Acute pain of left shoulder    Rx / DC Orders ED Discharge Orders         Ordered    predniSONE (DELTASONE) 20 MG tablet  Daily        06/05/20 2220    HYDROcodone-acetaminophen (NORCO/VICODIN) 5-325 MG tablet  Every 6  hours PRN        06/05/20 2220           Gwyneth Sprout, MD 06/05/20 2222

## 2020-06-05 NOTE — ED Notes (Signed)
IV placed in L forearm without complication. Labs obtained and sent to lab.

## 2020-06-05 NOTE — Discharge Instructions (Addendum)
All the blood work looked okay today and no sign of any issue with your heart.  Feel that this may be flare of your RA.  You can continue to take ibuprofen or Aleve starting tomorrow.  Also will try 5 days of steroids.

## 2020-07-01 ENCOUNTER — Emergency Department (HOSPITAL_BASED_OUTPATIENT_CLINIC_OR_DEPARTMENT_OTHER)
Admission: EM | Admit: 2020-07-01 | Discharge: 2020-07-02 | Disposition: A | Discharge status: Home / Self Care | Payer: Medicaid Other | Attending: Emergency Medicine | Admitting: Emergency Medicine

## 2020-07-01 ENCOUNTER — Encounter (HOSPITAL_BASED_OUTPATIENT_CLINIC_OR_DEPARTMENT_OTHER): Payer: Self-pay

## 2020-07-01 ENCOUNTER — Other Ambulatory Visit: Payer: Self-pay

## 2020-07-01 ENCOUNTER — Emergency Department (HOSPITAL_BASED_OUTPATIENT_CLINIC_OR_DEPARTMENT_OTHER): Payer: Medicaid Other

## 2020-07-01 DIAGNOSIS — M25561 Pain in right knee: Secondary | ICD-10-CM | POA: Insufficient documentation

## 2020-07-01 DIAGNOSIS — W19XXXA Unspecified fall, initial encounter: Secondary | ICD-10-CM

## 2020-07-01 DIAGNOSIS — Z5321 Procedure and treatment not carried out due to patient leaving prior to being seen by health care provider: Secondary | ICD-10-CM | POA: Diagnosis not present

## 2020-07-01 DIAGNOSIS — W1830XA Fall on same level, unspecified, initial encounter: Secondary | ICD-10-CM | POA: Insufficient documentation

## 2020-07-01 NOTE — ED Notes (Signed)
Called patient for Xray with no response.

## 2020-07-01 NOTE — ED Triage Notes (Signed)
Pt fell on 06/22/20 at a rest stop when her sandal got "hooked onto the tile and fell over onto her knees". Pt c/o right knee. Pt states she tried to take care of it at home but the pain did no improve with ice, resting, epsom bath and motrin.

## 2020-07-02 NOTE — ED Notes (Signed)
Pt called to Room No Answer

## 2020-07-08 ENCOUNTER — Other Ambulatory Visit: Payer: Self-pay

## 2020-07-08 ENCOUNTER — Encounter (HOSPITAL_BASED_OUTPATIENT_CLINIC_OR_DEPARTMENT_OTHER): Payer: Self-pay | Admitting: *Deleted

## 2020-07-08 ENCOUNTER — Emergency Department (HOSPITAL_BASED_OUTPATIENT_CLINIC_OR_DEPARTMENT_OTHER)
Admission: EM | Admit: 2020-07-08 | Discharge: 2020-07-08 | Disposition: A | Discharge status: Home / Self Care | Payer: Medicaid Other | Attending: Emergency Medicine | Admitting: Emergency Medicine

## 2020-07-08 DIAGNOSIS — M25512 Pain in left shoulder: Secondary | ICD-10-CM | POA: Insufficient documentation

## 2020-07-08 DIAGNOSIS — Z5321 Procedure and treatment not carried out due to patient leaving prior to being seen by health care provider: Secondary | ICD-10-CM | POA: Diagnosis not present

## 2020-07-08 DIAGNOSIS — W010XXA Fall on same level from slipping, tripping and stumbling without subsequent striking against object, initial encounter: Secondary | ICD-10-CM | POA: Insufficient documentation

## 2020-07-08 DIAGNOSIS — M25561 Pain in right knee: Secondary | ICD-10-CM | POA: Diagnosis not present

## 2020-07-08 DIAGNOSIS — M79671 Pain in right foot: Secondary | ICD-10-CM | POA: Insufficient documentation

## 2020-07-08 NOTE — ED Triage Notes (Signed)
Pt tripped and fell on June second as her sandal was caught in the floor tile. Pain in her rt foot, leg and knee. Also pain in the left shoulder.

## 2020-07-08 NOTE — ED Notes (Signed)
Patient called to Examination Room several times previously with no response. Patient will be discharged appropriately.

## 2020-07-10 ENCOUNTER — Emergency Department (HOSPITAL_BASED_OUTPATIENT_CLINIC_OR_DEPARTMENT_OTHER): Payer: Medicaid Other

## 2020-07-10 ENCOUNTER — Emergency Department (HOSPITAL_BASED_OUTPATIENT_CLINIC_OR_DEPARTMENT_OTHER)
Admission: EM | Admit: 2020-07-10 | Discharge: 2020-07-10 | Disposition: A | Discharge status: Home / Self Care | Payer: Medicaid Other | Attending: Emergency Medicine | Admitting: Emergency Medicine

## 2020-07-10 ENCOUNTER — Other Ambulatory Visit: Payer: Self-pay

## 2020-07-10 DIAGNOSIS — M25561 Pain in right knee: Secondary | ICD-10-CM | POA: Diagnosis present

## 2020-07-10 DIAGNOSIS — J45909 Unspecified asthma, uncomplicated: Secondary | ICD-10-CM | POA: Insufficient documentation

## 2020-07-10 DIAGNOSIS — W010XXA Fall on same level from slipping, tripping and stumbling without subsequent striking against object, initial encounter: Secondary | ICD-10-CM | POA: Diagnosis not present

## 2020-07-10 DIAGNOSIS — Z794 Long term (current) use of insulin: Secondary | ICD-10-CM | POA: Insufficient documentation

## 2020-07-10 DIAGNOSIS — Z79899 Other long term (current) drug therapy: Secondary | ICD-10-CM | POA: Diagnosis not present

## 2020-07-10 DIAGNOSIS — E119 Type 2 diabetes mellitus without complications: Secondary | ICD-10-CM | POA: Insufficient documentation

## 2020-07-10 DIAGNOSIS — I10 Essential (primary) hypertension: Secondary | ICD-10-CM | POA: Diagnosis not present

## 2020-07-10 DIAGNOSIS — F17210 Nicotine dependence, cigarettes, uncomplicated: Secondary | ICD-10-CM | POA: Insufficient documentation

## 2020-07-10 MED ORDER — KETOROLAC TROMETHAMINE 30 MG/ML IJ SOLN
30.0000 mg | Freq: Once | INTRAMUSCULAR | Status: AC
  Administered 2020-07-10: 30 mg via INTRAMUSCULAR
  Filled 2020-07-10: qty 1

## 2020-07-10 MED ORDER — NAPROXEN 500 MG PO TABS: 500.0000 mg | ORAL_TABLET | Freq: Two times a day (BID) | ORAL | 0 refills | Status: AC

## 2020-07-10 NOTE — ED Provider Notes (Signed)
[facility] EMERGENCY DEPT Provider Note   CSN: [id number] Arrival date & time: 07/10/20  3086     History Chief Complaint  Patient presents with   Kristin Hurst    Kristin Hurst is a 45 y.o. female.  HPI     This 45 year old female with a history of diabetes, hypertension who presents with right knee and leg pain.  Patient reports that she tripped and fell onto her bilateral knees on June 2.  She has had progressively worsening right knee pain.  She has been ambulatory.  Pain is worse when she is stationary and "it freezes up."  She reports taking Tylenol, ibuprofen, using heat and ice at home with minimal relief.  Denies numbness or tingling of the foot.  Denies other injury.  Currently rates her pain 8 out of 10.  Last took something for pain prior to going to bed.  Past Medical History:  Diagnosis Date   Anemia    Asthma    Diabetes mellitus without complication (HCC)    Graves disease    Hypertension    Thyroid disease     Patient Active Problem List   Diagnosis Date Noted   Elevated uric acid in blood 07/16/2019    Past Surgical History:  Procedure Laterality Date   THYROIDECTOMY       OB History   No obstetric history on file.     Family History  Problem Relation Age of Onset   Heart failure Mother    Diabetes Mother     Social History   Tobacco Use   Smoking status: Every Day    Packs/day: 0.25    Pack years: 0.00    Types: Cigarettes   Smokeless tobacco: Never  Vaping Use   Vaping Use: Never used  Substance Use Topics   Alcohol use: Yes    Comment: social    Drug use: Never    Home Medications Prior to Admission medications   Medication Sig Start Date End Date Taking? Authorizing Provider  naproxen (NAPROSYN) 500 MG tablet Take 1 tablet (500 mg total) by mouth 2 (two) times daily. 07/10/20  Yes Sharvi Mooneyhan, Mayer Masker, MD  acetaminophen (TYLENOL) 500 MG tablet Take 1,000 mg by mouth every 6 (six) hours as needed for moderate pain.     [provider]  amLODipine (NORVASC) 10 MG tablet Take 1 tablet (10 mg total) by mouth daily. 03/10/19 07/15/19  Barbette Merino, NP  diazepam (VALIUM) 5 MG tablet Take 1 tablet (5 mg total) by mouth every 12 (twelve) hours as needed for muscle spasms. 09/30/19   Barbette Merino, NP  Dulaglutide (TRULICITY) 0.75 MG/0.5ML SOPN Inject 0.75 mg into the skin once a week. Patient not taking: Reported on 07/15/2019    [provider]  ferrous sulfate 325 (65 FE) MG tablet Take 325 mg by mouth daily with breakfast.    [provider]  hydrochlorothiazide (HYDRODIURIL) 25 MG tablet Take 1 tablet (25 mg total) by mouth daily. 10/15/19   Barbette Merino, NP  HYDROcodone-acetaminophen (NORCO/VICODIN) 5-325 MG tablet Take 1 tablet by mouth every 6 (six) hours as needed for severe pain. 06/05/20   Gwyneth Sprout, MD  levothyroxine (SYNTHROID) 200 MCG tablet Take 1 tablet (200 mcg total) by mouth daily before breakfast. 02/03/20 05/03/20  Barbette Merino, NP  liraglutide (VICTOZA) 18 MG/3ML SOPN Inject 0.6 mg into the skin once a week. 09/30/19   Barbette Merino, NP  methocarbamol (ROBAXIN) 500 MG tablet Take 1  tablet (500 mg total) by mouth 2 (two) times daily. Patient not taking: Reported on 07/15/2019 06/19/19   Mannie Stabile, PA-C  Olopatadine HCl (PAZEO) 0.7 % SOLN Apply 1 drop to eye in the morning and at bedtime. 10/06/19   Barbette Merino, NP  omeprazole (PRILOSEC) 40 MG capsule Take 1 capsule (40 mg total) by mouth daily. 09/30/19 09/29/20  Barbette Merino, NP  Potassium 99 MG TABS Take 1 tablet by mouth daily.     [provider]  predniSONE (DELTASONE) 20 MG tablet Take 2 tablets (40 mg total) by mouth daily. 06/05/20   Gwyneth Sprout, MD  PROAIR HFA 108 (90 Base) MCG/ACT inhaler INHALE 1 TO 2 PUFFS INTO THE LUNGS EVERY 6 HOURS AS NEEDED FOR WHEEZING OR SHORTNESS OF BREATH 01/03/20   Barbette Merino, NP    Allergies    Shellfish allergy and Ace inhibitors  Review of  Systems   Review of Systems  Constitutional:  Negative for fever.  Musculoskeletal:        Right knee pain  Skin:  Negative for color change and wound.  All other systems reviewed and are negative.  Physical Exam Updated Vital Signs BP (!) 129/91 (BP Location: Right Arm)   Pulse 89   Temp 98.1 F (36.7 C) (Oral)   Resp 20   Ht 1.6 m (5\' 3" )   Wt 102.1 kg   SpO2 98%   BMI 39.86 kg/m   Physical Exam Vitals and nursing note reviewed.  Constitutional:      Appearance: She is well-developed. She is obese. She is not ill-appearing.  HENT:     Head: Normocephalic and atraumatic.  Eyes:     Pupils: Pupils are equal, round, and reactive to light.  Cardiovascular:     Rate and Rhythm: Normal rate and regular rhythm.  Pulmonary:     Effort: Pulmonary effort is normal. No respiratory distress.  Abdominal:     Palpations: Abdomen is soft.  Musculoskeletal:     Cervical back: Neck supple.     Comments: Tenderness palpation over the medial joint line, no significant effusion, no overlying skin changes, flexion and extension of the knee intact, 2+ DP pulse, neurovascular intact distally  Skin:    General: Skin is warm and dry.  Neurological:     Mental Status: She is alert and oriented to person, place, and time.    ED Results / Procedures / Treatments   Labs (all labs ordered are listed, but only abnormal results are displayed) Labs Reviewed - No data to display  EKG None  Radiology DG Knee Complete 4 Views Right  Result Date: 07/10/2020 CLINICAL DATA:  45 year old female status post fall earlier this month with continued pain. EXAM: RIGHT KNEE - COMPLETE 4+ VIEW COMPARISON:  None. FINDINGS: No joint effusion on the cross-table lateral view. Patella appears intact. Preserved alignment. Bone mineralization is within normal limits. No acute or subacute osseous abnormality identified. Medial greater than lateral compartment mild joint space loss and moderate degenerative  spurring. Mild patellofemoral degenerative spurring. IMPRESSION: Degenerative changes. No recent fracture or dislocation identified about the right knee. Electronically Signed   By: 47 M.D.   On: 07/10/2020 06:30    Procedures Procedures   Medications Ordered in ED Medications  ketorolac (TORADOL) 30 MG/ML injection 30 mg (30 mg Intramuscular Given 07/10/20 0542)    ED Course  I have reviewed the triage vital signs and the nursing notes.  Pertinent labs & imaging  results that were available during my care of the patient were reviewed by me and considered in my medical decision making (see chart for details).    MDM Rules/Calculators/A&P                          Patient presents with right knee pain.  Reports a fall greater than 2 weeks ago.  She has been ambulatory.  Exam with tenderness on the medial joint line.  No laxity noted.  Doubt fracture or dislocation given that she has been ambulatory.  With suspect meniscus or more ligamentous injury.  Patient is neurovascular intact.  She was given Toradol.  X-rays confirm no evidence of acute fracture.  These were independently reviewed by myself.  Recommend naproxen, ice for pain management.  Patient was placed in a knee immobilizer for comfort.  After history, exam, and medical workup I feel the patient has been appropriately medically screened and is safe for discharge home. Pertinent diagnoses were discussed with the patient. Patient was given return precautions.  Final Clinical Impression(s) / ED Diagnoses Final diagnoses:  Acute pain of right knee    Rx / DC Orders ED Discharge Orders          Ordered    naproxen (NAPROSYN) 500 MG tablet  2 times daily        07/10/20 0635             Shon Baton, MD 07/10/20 [phone]

## 2020-07-10 NOTE — Discharge Instructions (Addendum)
You are seen today for knee pain.  Your work-up is reassuring.  Your x-rays do not show any evidence of fracture or dislocation.  You can sometimes have meniscus or ligamentous injury that does not show up on x-ray.  Keep iced and elevated.  Take naproxen as needed for pain.  Follow-up with sports medicine if symptoms persist.

## 2020-07-10 NOTE — ED Triage Notes (Signed)
Pt to ED from home with c/o pain in right leg and left shoulder after pt suffered a fall on June 2nd earlier this month. Pt states she is able to bear weight but is still experiencing pain from where she impacted her knee.

## 2021-08-29 ENCOUNTER — Encounter (INDEPENDENT_AMBULATORY_CARE_PROVIDER_SITE_OTHER): Payer: Self-pay
